# Patient Record
Sex: Male | Born: 1970 | Race: White | Hispanic: No | Marital: Single | Smoking: Never smoker
Health system: Southern US, Community
[De-identification: ages and names within clinical notes are randomized; demographics above are authoritative.]

## PROBLEM LIST (undated history)

## (undated) DIAGNOSIS — F259 Schizoaffective disorder, unspecified: Secondary | ICD-10-CM

## (undated) DIAGNOSIS — F71 Moderate intellectual disabilities: Secondary | ICD-10-CM

## (undated) DIAGNOSIS — E079 Disorder of thyroid, unspecified: Secondary | ICD-10-CM

## (undated) DIAGNOSIS — L309 Dermatitis, unspecified: Secondary | ICD-10-CM

## (undated) HISTORY — PX: OTHER SURGICAL HISTORY: SHX169

## (undated) HISTORY — PX: HAND SURGERY: SHX662

---

## 2012-05-13 ENCOUNTER — Inpatient Hospital Stay: Payer: Self-pay | Admitting: Psychiatry

## 2012-05-13 LAB — DRUG SCREEN, URINE
Amphetamines, Ur Screen: NEGATIVE (ref ?–1000)
Barbiturates, Ur Screen: NEGATIVE (ref ?–200)
Cannabinoid 50 Ng, Ur ~~LOC~~: NEGATIVE (ref ?–50)
Cocaine Metabolite,Ur ~~LOC~~: NEGATIVE (ref ?–300)
Methadone, Ur Screen: NEGATIVE (ref ?–300)
Opiate, Ur Screen: NEGATIVE (ref ?–300)

## 2012-05-13 LAB — URINALYSIS, COMPLETE
Glucose,UR: NEGATIVE mg/dL (ref 0–75)
Hyaline Cast: 4
Leukocyte Esterase: NEGATIVE
Nitrite: NEGATIVE
Protein: 30
RBC,UR: 46 /HPF (ref 0–5)
Specific Gravity: 1.026 (ref 1.003–1.030)
WBC UR: 12 /HPF (ref 0–5)

## 2012-05-13 LAB — COMPREHENSIVE METABOLIC PANEL
Albumin: 3.4 g/dL (ref 3.4–5.0)
Alkaline Phosphatase: 66 U/L (ref 50–136)
Anion Gap: 9 (ref 7–16)
BUN: 25 mg/dL — ABNORMAL HIGH (ref 7–18)
Chloride: 108 mmol/L — ABNORMAL HIGH (ref 98–107)
Co2: 25 mmol/L (ref 21–32)
Creatinine: 1.44 mg/dL — ABNORMAL HIGH (ref 0.60–1.30)
EGFR (Non-African Amer.): 59 — ABNORMAL LOW
Glucose: 116 mg/dL — ABNORMAL HIGH (ref 65–99)
Potassium: 3.8 mmol/L (ref 3.5–5.1)
SGOT(AST): 57 U/L — ABNORMAL HIGH (ref 15–37)
SGPT (ALT): 31 U/L
Sodium: 142 mmol/L (ref 136–145)
Total Protein: 7.2 g/dL (ref 6.4–8.2)

## 2012-05-13 LAB — CBC
HGB: 13.1 g/dL (ref 13.0–18.0)
MCH: 31.1 pg (ref 26.0–34.0)
RBC: 4.2 10*6/uL — ABNORMAL LOW (ref 4.40–5.90)
RDW: 15 % — ABNORMAL HIGH (ref 11.5–14.5)
WBC: 5.8 10*3/uL (ref 3.8–10.6)

## 2012-05-13 LAB — ETHANOL
Ethanol %: <0.003 % (ref 0.000–0.080)
Ethanol: <3 mg/dL

## 2012-05-13 LAB — VALPROIC ACID LEVEL: Valproic Acid: 36 ug/mL — ABNORMAL LOW

## 2012-05-13 LAB — TSH: Thyroid Stimulating Horm: 2.05 u[IU]/mL

## 2012-05-15 LAB — LIPID PANEL
HDL Cholesterol: 22 mg/dL — ABNORMAL LOW (ref 40–60)
VLDL Cholesterol, Calc: 24 mg/dL (ref 5–40)

## 2012-05-18 LAB — URINALYSIS, COMPLETE
Bilirubin,UR: NEGATIVE
Ketone: NEGATIVE
Leukocyte Esterase: NEGATIVE
Ph: 6 (ref 4.5–8.0)
RBC,UR: 15 /HPF (ref 0–5)
Specific Gravity: 1.012 (ref 1.003–1.030)
Squamous Epithelial: NONE SEEN
WBC UR: 1 /HPF (ref 0–5)

## 2012-05-18 LAB — VALPROIC ACID LEVEL: Valproic Acid: 68 ug/mL

## 2012-05-20 LAB — URINALYSIS, COMPLETE
Bacteria: NONE SEEN
Glucose,UR: NEGATIVE mg/dL (ref 0–75)
Ketone: NEGATIVE
Leukocyte Esterase: NEGATIVE
Ph: 8 (ref 4.5–8.0)
WBC UR: <1 /HPF (ref 0–5)

## 2012-06-14 ENCOUNTER — Emergency Department: Payer: Self-pay | Admitting: Emergency Medicine

## 2012-06-14 LAB — URINALYSIS, COMPLETE
Ketone: NEGATIVE
Nitrite: NEGATIVE
Ph: 6 (ref 4.5–8.0)
Protein: NEGATIVE
RBC,UR: 13 /HPF (ref 0–5)
Specific Gravity: 1.016 (ref 1.003–1.030)

## 2012-06-14 LAB — CBC
HCT: 35.1 % — ABNORMAL LOW (ref 40.0–52.0)
HGB: 11.6 g/dL — ABNORMAL LOW (ref 13.0–18.0)
MCV: 94 fL (ref 80–100)
Platelet: 110 10*3/uL — ABNORMAL LOW (ref 150–440)
RBC: 3.73 10*6/uL — ABNORMAL LOW (ref 4.40–5.90)
RDW: 13.9 % (ref 11.5–14.5)
WBC: 3.6 10*3/uL — ABNORMAL LOW (ref 3.8–10.6)

## 2012-06-14 LAB — DRUG SCREEN, URINE
Barbiturates, Ur Screen: NEGATIVE (ref ?–200)
Benzodiazepine, Ur Scrn: NEGATIVE (ref ?–200)
Cannabinoid 50 Ng, Ur ~~LOC~~: NEGATIVE (ref ?–50)
MDMA (Ecstasy)Ur Screen: NEGATIVE (ref ?–500)
Opiate, Ur Screen: NEGATIVE (ref ?–300)
Phencyclidine (PCP) Ur S: NEGATIVE (ref ?–25)

## 2012-06-14 LAB — COMPREHENSIVE METABOLIC PANEL
Alkaline Phosphatase: 53 U/L (ref 50–136)
Anion Gap: 3 — ABNORMAL LOW (ref 7–16)
Bilirubin,Total: 0.5 mg/dL (ref 0.2–1.0)
Chloride: 108 mmol/L — ABNORMAL HIGH (ref 98–107)
Co2: 32 mmol/L (ref 21–32)
Creatinine: 1.02 mg/dL (ref 0.60–1.30)
EGFR (African American): >60
EGFR (Non-African Amer.): >60
SGOT(AST): 56 U/L — ABNORMAL HIGH (ref 15–37)
SGPT (ALT): 30 U/L
Sodium: 143 mmol/L (ref 136–145)

## 2012-06-14 LAB — ACETAMINOPHEN LEVEL: Acetaminophen: <2 ug/mL

## 2012-06-14 LAB — VALPROIC ACID LEVEL: Valproic Acid: 26 ug/mL — ABNORMAL LOW

## 2013-03-18 ENCOUNTER — Emergency Department: Payer: Self-pay | Admitting: Emergency Medicine

## 2013-03-18 LAB — COMPREHENSIVE METABOLIC PANEL
Albumin: 3.2 g/dL — ABNORMAL LOW (ref 3.4–5.0)
Alkaline Phosphatase: 53 U/L (ref 50–136)
BUN: 18 mg/dL (ref 7–18)
Co2: 28 mmol/L (ref 21–32)
EGFR (African American): >60
Osmolality: 288 (ref 275–301)
Sodium: 143 mmol/L (ref 136–145)
Total Protein: 6.7 g/dL (ref 6.4–8.2)

## 2013-03-18 LAB — DRUG SCREEN, URINE
Amphetamines, Ur Screen: NEGATIVE (ref ?–1000)
Barbiturates, Ur Screen: NEGATIVE (ref ?–200)
Cannabinoid 50 Ng, Ur ~~LOC~~: NEGATIVE (ref ?–50)
Cocaine Metabolite,Ur ~~LOC~~: NEGATIVE (ref ?–300)
MDMA (Ecstasy)Ur Screen: NEGATIVE (ref ?–500)
Phencyclidine (PCP) Ur S: NEGATIVE (ref ?–25)

## 2013-03-18 LAB — URINALYSIS, COMPLETE
Bacteria: NONE SEEN
Glucose,UR: NEGATIVE mg/dL (ref 0–75)
Ketone: NEGATIVE
Nitrite: NEGATIVE
Ph: 6 (ref 4.5–8.0)
Protein: 30
Squamous Epithelial: <1
WBC UR: 1 /HPF (ref 0–5)

## 2013-03-18 LAB — CBC
HCT: 36.9 % — ABNORMAL LOW (ref 40.0–52.0)
HGB: 12 g/dL — ABNORMAL LOW (ref 13.0–18.0)
MCH: 30 pg (ref 26.0–34.0)
Platelet: 105 10*3/uL — ABNORMAL LOW (ref 150–440)
WBC: 3.9 10*3/uL (ref 3.8–10.6)

## 2013-04-04 ENCOUNTER — Emergency Department: Payer: Self-pay | Admitting: Emergency Medicine

## 2013-08-14 ENCOUNTER — Emergency Department: Payer: Self-pay | Admitting: Emergency Medicine

## 2013-08-14 LAB — TSH: Thyroid Stimulating Horm: 2.37 u[IU]/mL

## 2013-08-14 LAB — COMPREHENSIVE METABOLIC PANEL
Alkaline Phosphatase: 58 U/L (ref 50–136)
BUN: 19 mg/dL — ABNORMAL HIGH (ref 7–18)
Bilirubin,Total: 0.5 mg/dL (ref 0.2–1.0)
Calcium, Total: 8.4 mg/dL — ABNORMAL LOW (ref 8.5–10.1)
Chloride: 108 mmol/L — ABNORMAL HIGH (ref 98–107)
Creatinine: 1.34 mg/dL — ABNORMAL HIGH (ref 0.60–1.30)
EGFR (African American): >60
EGFR (Non-African Amer.): >60
Potassium: 3.5 mmol/L (ref 3.5–5.1)
SGOT(AST): 57 U/L — ABNORMAL HIGH (ref 15–37)
Total Protein: 6.4 g/dL (ref 6.4–8.2)

## 2013-08-14 LAB — SALICYLATE LEVEL: Salicylates, Serum: <1.7 mg/dL

## 2013-08-14 LAB — CBC
HCT: 34.9 % — ABNORMAL LOW (ref 40.0–52.0)
HGB: 11.7 g/dL — ABNORMAL LOW (ref 13.0–18.0)
MCH: 31.2 pg (ref 26.0–34.0)
MCHC: 33.6 g/dL (ref 32.0–36.0)
MCV: 93 fL (ref 80–100)
RDW: 13.3 % (ref 11.5–14.5)
WBC: 4.4 10*3/uL (ref 3.8–10.6)

## 2013-08-14 LAB — ACETAMINOPHEN LEVEL: Acetaminophen: <2 ug/mL

## 2013-08-14 LAB — ETHANOL: Ethanol %: <0.003 % (ref 0.000–0.080)

## 2013-08-15 LAB — URINALYSIS, COMPLETE
Bacteria: NONE SEEN
Bilirubin,UR: NEGATIVE
Glucose,UR: NEGATIVE mg/dL (ref 0–75)
Ph: 6 (ref 4.5–8.0)
Protein: NEGATIVE
RBC,UR: 10 /HPF (ref 0–5)
Squamous Epithelial: <1
WBC UR: 1 /HPF (ref 0–5)

## 2013-08-15 LAB — DRUG SCREEN, URINE
Barbiturates, Ur Screen: NEGATIVE (ref ?–200)
Cocaine Metabolite,Ur ~~LOC~~: NEGATIVE (ref ?–300)
MDMA (Ecstasy)Ur Screen: NEGATIVE (ref ?–500)
Opiate, Ur Screen: NEGATIVE (ref ?–300)
Phencyclidine (PCP) Ur S: NEGATIVE (ref ?–25)
Tricyclic, Ur Screen: NEGATIVE (ref ?–1000)

## 2013-11-22 ENCOUNTER — Emergency Department: Payer: Self-pay | Admitting: Emergency Medicine
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
## 2015-03-17 NOTE — Discharge Summary (Signed)
PATIENT NAME:  Bradley Murphy, Bradley Murphy MR#:  [id number] DATE OF BIRTH:  [birth year]  DATE OF ADMISSION:  05/13/2012 DATE OF DISCHARGE:  05/20/2012   HOSPITAL COURSE: See dictated history and physical for details. This is a 44 year old man with a history of unspecified mental illness, possibly schizoaffective disorder as well as chronic cognitive dysfunction, probably moderate mental retardation, who was brought to the hospital after making an alleged suicide attempt and voicing suicidal ideation. In the hospital he initially did state some suicidal ideation and repeated that he felt that nobody cared for him. Here in the hospital he has not attempted to act on it or to try to hurt himself. He was initially down and dysphoric and somewhat reserved but he was easy to engage in groups and activities. He seems to enjoy being around other people and being included in groups. He was never very verbal but he was appropriate in his behavior. Some changes were made to his medication. I have increased his Remeron for more effective treatment of depression. I have discontinued his Haldol because he looks to me like he may have some tardive dyskinesia or some stiffness. I have greatly decreased the total dose of Cogentin that he is taking because of the fact that it was really an excessively high dose and could have been causing hallucinations and confusion. He has tolerated all of these changes. I have started him on a low dose of Seroquel at night. He continues to be on his Abilify. The intention at this point is that it could be considered that he could be switched from one to the other in order to decrease risk of extrapyramidal symptoms. He now has a valproic acid level last tested at 68, which is in the usually thought of as therapeutic range. One medical issue that has arisen is he has microscopic hematuria persistent in his urinalysis. Medicine was consulted and felt that if it persisted he should have a Urology consult. The  patient is not symptomatic from it. We recommend that this be followed up with his medical problems in the future to see if it persists and if it needs to be evaluated. At this time he is denying any suicidal ideation and not behaving in a dangerous suicidal or aggressive manner and not endorsing any hallucinations or psychotic symptoms. He is agreeable to discharge back to his group home.   LABORATORY, DIAGNOSTIC, AND RADIOLOGICAL DATA: Admission chemistries showed hematocrit slightly low at 38.9. Glucose was elevated at 116, BUN 25, creatinine 1.44, chloride 108, AST 57. Alcohol not detectable. TSH normal. Urinalysis had 3+ blood, 30 protein, 46 red blood cells, and 12 white blood cells. Drug screen was negative. Valproic acid level on admission 36.   EKG showed first degree AV block, otherwise nonspecific ST abnormality. QTc 437, not elevated.   Total cholesterol 116, triglycerides 121, HDL low at 22, VLDL 24, LDL 70. Repeat urinalysis shows white blood cells diminishing to gone, no bacteria but persistent microscopic hematuria.   DISCHARGE MEDICATIONS:  1. Quetiapine 100 mg at bedtime.  2. Mirtazapine 45 mg at bedtime.  3. Cogentin 0.5 mg 3 times a day.  4. Depakote extended-release 1500 mg at bedtime.  5. Lamisil 250 mg per day.  6. Paxil 40 mg per day.  7. Fenofibric acid 135 mg per day.  8. Ativan 1 mg 3 times a day.  9. Synthroid 50 mcg per a.m.  10. Docusate 100 mg at night.  11. Aripiprazole 30 mg per day.   MENTAL  STATUS EXAMINATION: Casually but neatly dressed man whose hygiene is greatly improved. Cooperative and pleasant with the interview. Good eye contact. Limited psychomotor activity. Affect stays blunted and flat most of the time. Not agitated. Not tearful. Mood stated as good. Thoughts are very slow and limited in amount. No grossly bizarre thoughts. No psychotic evidence. No evidence of paranoia. Denies hallucinations. Totally denies suicidal or homicidal ideation. Says that  he is looking forward to going back home and wants to take care of his body. The patient is chronically clearly cognitively impaired. Speech is slow. He is oriented only very generally to being in the hospital. Judgment and insight cannot be relied on with his degree of cognitive impairment.   DISPOSITION:  1. Discharge back to group home. 2. Continue current medication. 3. Follow-up will be presumed to be [facility].   DIAGNOSES PRINCIPLE AND PRIMARY:  AXIS I: Schizoaffective disorder, bipolar type.   SECONDARY DIAGNOSES:  AXIS I: No further diagnosis.   AXIS II: Mental retardation, probably moderate severity.   AXIS III:  1. Microscopic hematuria. 2. History of abnormal lipids.  3. Hypothyroid. 4. Constipation.   AXIS IV: Moderate to severe stress from having recently changed group homes, having limited social contact in the area.   AXIS V: Functioning at time of discharge 48.   ____________________________ Audery AmelJohn T. May Manrique, MD jtc:drc D: 05/20/2012 11:00:00 ET T: 05/20/2012 13:44:26 ET JOB#: [id number]  cc: Audery AmelJohn T. Awilda Covin, MD, <Dictator> Audery AmelJOHN T Caedyn Tassinari MD ELECTRONICALLY SIGNED 05/21/2012 23:18
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
## 2016-01-11 ENCOUNTER — Emergency Department
Admission: EM | Admit: 2016-01-11 | Discharge: 2016-01-13 | Disposition: A | Discharge status: Other Acute Inpatient Hospital | Payer: Medicaid Other | Attending: Emergency Medicine | Admitting: Emergency Medicine

## 2016-01-11 DIAGNOSIS — F329 Major depressive disorder, single episode, unspecified: Secondary | ICD-10-CM

## 2016-01-11 DIAGNOSIS — Z046 Encounter for general psychiatric examination, requested by authority: Secondary | ICD-10-CM | POA: Diagnosis present

## 2016-01-11 DIAGNOSIS — F1510 Other stimulant abuse, uncomplicated: Secondary | ICD-10-CM | POA: Insufficient documentation

## 2016-01-11 DIAGNOSIS — F32A Depression, unspecified: Secondary | ICD-10-CM

## 2016-01-11 DIAGNOSIS — F1310 Sedative, hypnotic or anxiolytic abuse, uncomplicated: Secondary | ICD-10-CM | POA: Insufficient documentation

## 2016-01-11 DIAGNOSIS — F1610 Hallucinogen abuse, uncomplicated: Secondary | ICD-10-CM | POA: Diagnosis not present

## 2016-01-11 DIAGNOSIS — F333 Major depressive disorder, recurrent, severe with psychotic symptoms: Secondary | ICD-10-CM | POA: Diagnosis not present

## 2016-01-11 DIAGNOSIS — R45851 Suicidal ideations: Secondary | ICD-10-CM

## 2016-01-11 HISTORY — DX: Dermatitis, unspecified: L30.9

## 2016-01-11 HISTORY — DX: Moderate intellectual disabilities: F71

## 2016-01-11 HISTORY — DX: Schizoaffective disorder, unspecified: F25.9

## 2016-01-11 HISTORY — DX: Disorder of thyroid, unspecified: E07.9

## 2016-01-11 NOTE — ED Notes (Signed)
Pt here voluntarily but says he's suicidal; [place] officer sitting with pt between triage rooms 1 and 2 until he is triaged by staff and moved to treatment room
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
## 2016-01-12 ENCOUNTER — Encounter: Payer: Self-pay | Admitting: Emergency Medicine

## 2016-01-12 LAB — CBC
HCT: 39 % — ABNORMAL LOW (ref 40.0–52.0)
Hemoglobin: 13.2 g/dL (ref 13.0–18.0)
MCH: 30.4 pg (ref 26.0–34.0)
MCHC: 34 g/dL (ref 32.0–36.0)
MCV: 89.5 fL (ref 80.0–100.0)
PLATELETS: 118 10*3/uL — AB (ref 150–440)
RBC: 4.36 MIL/uL — AB (ref 4.40–5.90)
RDW: 13.7 % (ref 11.5–14.5)
WBC: 4.7 10*3/uL (ref 3.8–10.6)

## 2016-01-12 LAB — COMPREHENSIVE METABOLIC PANEL
ALT: 20 U/L (ref 17–63)
ANION GAP: 8 (ref 5–15)
AST: 36 U/L (ref 15–41)
Albumin: 3.7 g/dL (ref 3.5–5.0)
Alkaline Phosphatase: 52 U/L (ref 38–126)
BUN: 19 mg/dL (ref 6–20)
CHLORIDE: 107 mmol/L (ref 101–111)
CO2: 27 mmol/L (ref 22–32)
CREATININE: 1.39 mg/dL — AB (ref 0.61–1.24)
Calcium: 9 mg/dL (ref 8.9–10.3)
GFR, EST NON AFRICAN AMERICAN: 59 mL/min — AB (ref >60)
Glucose, Bld: 143 mg/dL — ABNORMAL HIGH (ref 65–99)
POTASSIUM: 3.7 mmol/L (ref 3.5–5.1)
Sodium: 142 mmol/L (ref 135–145)
Total Bilirubin: 0.4 mg/dL (ref 0.3–1.2)
Total Protein: 7 g/dL (ref 6.5–8.1)

## 2016-01-12 LAB — URINE DRUG SCREEN, QUALITATIVE (ARMC ONLY)
Amphetamines, Ur Screen: NOT DETECTED
BARBITURATES, UR SCREEN: NOT DETECTED
Benzodiazepine, Ur Scrn: POSITIVE — AB
COCAINE METABOLITE, UR ~~LOC~~: NOT DETECTED
Cannabinoid 50 Ng, Ur ~~LOC~~: NOT DETECTED
MDMA (ECSTASY) UR SCREEN: POSITIVE — AB
METHADONE SCREEN, URINE: NOT DETECTED
Opiate, Ur Screen: NOT DETECTED
Phencyclidine (PCP) Ur S: POSITIVE — AB
TRICYCLIC, UR SCREEN: POSITIVE — AB

## 2016-01-12 LAB — SALICYLATE LEVEL

## 2016-01-12 LAB — ETHANOL: Alcohol, Ethyl (B): <5 mg/dL (ref <5)

## 2016-01-12 LAB — ACETAMINOPHEN LEVEL

## 2016-01-12 MED ORDER — SODIUM CHLORIDE 0.9 % IV BOLUS (SEPSIS)
1000.0000 mL | Freq: Once | INTRAVENOUS | Status: AC
  Administered 2016-01-12: 1000 mL via INTRAVENOUS

## 2016-01-12 NOTE — BH Assessment (Signed)
Assessment Completed  Consulted with Alveda Reasons, Nurse Practitioner Denice Bors), who discussed patient with Dr. Lynnae Sandhoff (Psychiatrist) and they recommend Outpatient Treatment. Patient can be discharge back to Group home.  ER MD (Dr. Fanny Bien) informed of the recommendation
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
## 2016-01-12 NOTE — ED Notes (Signed)
Patient asleep in room. No noted distress or abnormal behavior. Will continue 15 minute checks and observation by security cameras for safety. 

## 2016-01-12 NOTE — ED Notes (Signed)
BEHAVIORAL HEALTH ROUNDING Patient sleeping: Yes.   Patient alert and oriented: no Behavior appropriate: Yes.  ; If no, describe:  Nutrition and fluids offered: No Toileting and hygiene offered: No Sitter present: not applicable Law enforcement present: Yes  

## 2016-01-12 NOTE — ED Notes (Signed)
Meal given. Patient in no acute distress. Maintained on 15 minute checks and observation by security camera for safety. 

## 2016-01-12 NOTE — ED Notes (Signed)
Report from dawn, rn. Pt cleared to move to [facility].

## 2016-01-12 NOTE — ED Notes (Signed)
Pt with hr of 115 lying down, sitting up 134. Dr. Zenda Alpers notified.

## 2016-01-12 NOTE — ED Notes (Signed)
Pt oriented to unit. Pt alert to self, situation, year, month and day of week. Pt denies needs. Pt moving all extremities without difficulty. resps unlabored, skin pwd. Hr 96 by radial pulse.

## 2016-01-12 NOTE — ED Notes (Signed)
ED BHU PLACEMENT JUSTIFICATION Is the patient under IVC or is there intent for IVC: No. Is the patient medically cleared: Yes.   Is there vacancy in the ED BHU: Yes.   Is the population mix appropriate for patient: Yes.   Is the patient awaiting placement in inpatient or outpatient setting: Yes.   Has the patient had a psychiatric consult: No Survey of unit performed for contraband, proper placement and condition of furniture, tampering with fixtures in bathroom, shower, and each patient room: Yes.  ; Findings:  APPEARANCE/BEHAVIOR calm, cooperative and adequate rapport can be established NEURO ASSESSMENT Orientation: time, place and person Hallucinations: No.None noted (Hallucinations) Speech: Normal Gait: shuffle RESPIRATORY ASSESSMENT Normal expansion.  Clear to auscultation.  No rales, rhonchi, or wheezing. CARDIOVASCULAR ASSESSMENT regular rate and rhythm, S1, S2 normal, no murmur, click, rub or gallop GASTROINTESTINAL ASSESSMENT soft, nontender, BS WNL, no r/g EXTREMITIES normal strength, tone, and muscle mass PLAN OF CARE Provide calm/safe environment. Vital signs assessed twice daily. ED BHU Assessment once each 12-hour shift. Collaborate with intake RN daily or as condition indicates. Assure the ED provider has rounded once each shift. Provide and encourage hygiene. Provide redirection as needed. Assess for escalating behavior; address immediately and inform ED provider.  Assess family dynamic and appropriateness for visitation as needed: Yes.  ; If necessary, describe findings:  Educate the patient/family about BHU procedures/visitation: Yes.  ; If necessary, describe findings:  

## 2016-01-12 NOTE — ED Notes (Signed)
Patient requesting discharge back to group home but has not been evaluated by [facility].  Maintained on 15 minute checks and observation by security camera for safety.

## 2016-01-12 NOTE — ED Notes (Signed)
Pt. Moved to Room #24 in ED to have [facility].
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
## 2016-01-12 NOTE — ED Notes (Signed)
BEHAVIORAL HEALTH ROUNDING Patient sleeping: Yes.   Patient alert and oriented: no Behavior appropriate: Yes.  ; If no, describe:  Nutrition and fluids offered: No Toileting and hygiene offered: No Sitter present: No Law enforcement present: Yes  

## 2016-01-12 NOTE — ED Notes (Signed)
Patient now denies SI or HI. Feels safe to return to group home. Waiting for psychiatric consult.Maintained on 15 minute checks and observation by security camera for safety.
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
## 2016-01-12 NOTE — ED Notes (Signed)

## 2016-01-12 NOTE — ED Notes (Signed)
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
## 2016-01-13 DIAGNOSIS — F333 Major depressive disorder, recurrent, severe with psychotic symptoms: Secondary | ICD-10-CM

## 2016-01-13 MED ORDER — LEVOTHYROXINE SODIUM 100 MCG PO TABS
50.0000 ug | ORAL_TABLET | Freq: Every day | ORAL | Status: DC
  Administered 2016-01-13: 50 ug via ORAL
  Filled 2016-01-13: qty 1

## 2016-01-13 MED ORDER — LORAZEPAM 1 MG PO TABS
1.0000 mg | ORAL_TABLET | Freq: Four times a day (QID) | ORAL | Status: DC
  Administered 2016-01-13: 1 mg via ORAL
  Filled 2016-01-13: qty 1

## 2016-01-13 MED ORDER — DARIFENACIN HYDROBROMIDE ER 7.5 MG PO TB24
7.5000 mg | ORAL_TABLET | Freq: Every day | ORAL | Status: DC
  Administered 2016-01-13: 7.5 mg via ORAL
  Filled 2016-01-13: qty 1

## 2016-01-13 MED ORDER — DOCUSATE SODIUM 100 MG PO CAPS
100.0000 mg | ORAL_CAPSULE | Freq: Every day | ORAL | Status: DC
  Administered 2016-01-13: 100 mg via ORAL
  Filled 2016-01-13: qty 1

## 2016-01-13 MED ORDER — LORAZEPAM 2 MG PO TABS: 2.0000 mg | ORAL_TABLET | Freq: Four times a day (QID) | ORAL | Status: DC | PRN

## 2016-01-13 MED ORDER — HALOPERIDOL 5 MG PO TABS: 5.0000 mg | ORAL_TABLET | Freq: Four times a day (QID) | ORAL | Status: DC | PRN

## 2016-01-13 MED ORDER — DIVALPROEX SODIUM 500 MG PO DR TAB: 1000.0000 mg | DELAYED_RELEASE_TABLET | Freq: Every day | ORAL | Status: DC

## 2016-01-13 MED ORDER — MIRTAZAPINE 15 MG PO TABS: 45.0000 mg | ORAL_TABLET | Freq: Every day | ORAL | Status: DC

## 2016-01-13 MED ORDER — FENOFIBRATE 160 MG PO TABS
160.0000 mg | ORAL_TABLET | Freq: Every day | ORAL | Status: DC
  Administered 2016-01-13: 160 mg via ORAL
  Filled 2016-01-13: qty 1

## 2016-01-13 MED ORDER — PAROXETINE HCL 20 MG PO TABS: 40.0000 mg | ORAL_TABLET | Freq: Every day | ORAL | Status: DC

## 2016-01-13 MED ORDER — TERBINAFINE HCL 250 MG PO TABS
250.0000 mg | ORAL_TABLET | Freq: Every day | ORAL | Status: DC
  Administered 2016-01-13: 250 mg via ORAL
  Filled 2016-01-13: qty 1

## 2016-01-13 MED ORDER — BENZTROPINE MESYLATE 1 MG PO TABS
1.0000 mg | ORAL_TABLET | Freq: Two times a day (BID) | ORAL | Status: DC
  Administered 2016-01-13: 1 mg via ORAL
  Filled 2016-01-13: qty 1

## 2016-01-13 MED ORDER — ARIPIPRAZOLE 10 MG PO TABS: 40.0000 mg | ORAL_TABLET | Freq: Every day | ORAL | Status: DC

## 2016-01-13 NOTE — ED Notes (Signed)
BEHAVIORAL HEALTH ROUNDING Patient sleeping: Yes.   Patient alert and oriented: not applicable Behavior appropriate: Yes.  ; If no, describe:  Nutrition and fluids offered: No Toileting and hygiene offered: No Sitter present: not applicable Law enforcement present: Yes  

## 2016-01-13 NOTE — ED Notes (Signed)
BEHAVIORAL HEALTH ROUNDING Patient sleeping: Yes.   Patient alert and oriented: no Behavior appropriate: Yes.  ; If no, describe:  Nutrition and fluids offered: No Toileting and hygiene offered: No Sitter present: not applicable Law enforcement present: Yes  

## 2016-01-13 NOTE — ED Notes (Signed)
Patient is awake and alert. He is aware that he is in the hospital. Requesting to return to group home. He denies SI. Patient ate breakfast and took all medications without incident. Maintained on 15 minute checks and observation by security camera for safety.

## 2016-01-13 NOTE — ED Provider Notes (Signed)
-----------------------------------------   2:46 AM on 01/13/2016 -----------------------------------------   Blood pressure 100/57, pulse 80, temperature 98.5 F (36.9 C), temperature source Oral, resp. rate 20, height  (1.626 m), weight 81.647 kg, SpO2 97 %.  The patient had no acute events since last update.  Calm and cooperative at this time.  Psych specialist on-call diagnosis the patient with severe depressive disorder, recurrent episode, with psychotic features.  Inpatient admission is recommended.  I have reviewed the printed report and put in medication orders as recommended.   Loleta Rose, MD 01/13/16 [phone]

## 2016-01-13 NOTE — Discharge Instructions (Signed)
Major Depressive Disorder Major depressive disorder is a mental illness. It also may be called clinical depression or unipolar depression. Major depressive disorder usually causes feelings of sadness, hopelessness, or helplessness. Some people with this disorder do not feel particularly sad but lose interest in doing things they used to enjoy (anhedonia). Major depressive disorder also can cause physical symptoms. It can interfere with work, school, relationships, and other normal everyday activities. The disorder varies in severity but is longer lasting and more serious than the sadness we all feel from time to time in our lives. Major depressive disorder often is triggered by stressful life events or major life changes. Examples of these triggers include divorce, loss of your job or home, a move, and the death of a family member or close friend. Sometimes this disorder occurs for no obvious reason at all. People who have family members with major depressive disorder or bipolar disorder are at higher risk for developing this disorder, with or without life stressors. Major depressive disorder can occur at any age. It may occur just once in your life (single episode major depressive disorder). It may occur multiple times (recurrent major depressive disorder). SYMPTOMS People with major depressive disorder have either anhedonia or depressed mood on nearly a daily basis for at least 2 weeks or longer. Symptoms of depressed mood include:  Feelings of sadness (blue or down in the dumps) or emptiness.  Feelings of hopelessness or helplessness.  Tearfulness or episodes of crying (may be observed by others).  Irritability (children and adolescents). In addition to depressed mood or anhedonia or both, people with this disorder have at least four of the following symptoms:  Difficulty sleeping or sleeping too much.   Significant change (increase or decrease) in appetite or weight.   Lack of energy or  motivation.  Feelings of guilt and worthlessness.   Difficulty concentrating, remembering, or making decisions.  Unusually slow movement (psychomotor retardation) or restlessness (as observed by others).   Recurrent wishes for death, recurrent thoughts of self-harm (suicide), or a suicide attempt. People with major depressive disorder commonly have persistent negative thoughts about themselves, other people, and the world. People with severe major depressive disorder may experiencedistorted beliefs or perceptions about the world (psychotic delusions). They also may see or hear things that are not real (psychotic hallucinations). DIAGNOSIS Major depressive disorder is diagnosed through an assessment by your health care provider. Your health care provider will ask aboutaspects of your daily life, such as mood,sleep, and appetite, to see if you have the diagnostic symptoms of major depressive disorder. Your health care provider may ask about your medical history and use of alcohol or drugs, including prescription medicines. Your health care provider also may do a physical exam and blood work. This is because certain medical conditions and the use of certain substances can cause major depressive disorder-like symptoms (secondary depression). Your health care provider also may refer you to a mental health specialist for further evaluation and treatment. TREATMENT It is important to recognize the symptoms of major depressive disorder and seek treatment. The following treatments can be prescribed for this disorder:   Medicine. Antidepressant medicines usually are prescribed. Antidepressant medicines are thought to correct chemical imbalances in the brain that are commonly associated with major depressive disorder. Other types of medicine may be added if the symptoms do not respond to antidepressant medicines alone or if psychotic delusions or hallucinations occur.  Talk therapy. Talk therapy can be  helpful in treating major depressive disorder by providing   support, education, and guidance. Certain types of talk therapy also can help with negative thinking (cognitive behavioral therapy) and with relationship issues that trigger this disorder (interpersonal therapy). A mental health specialist can help determine which treatment is best for you. Most people with major depressive disorder do well with a combination of medicine and talk therapy. Treatments involving electrical stimulation of the brain can be used in situations with extremely severe symptoms or when medicine and talk therapy do not work over time. These treatments include electroconvulsive therapy, transcranial magnetic stimulation, and vagal nerve stimulation.   This information is not intended to replace advice given to you by your health care provider. Make sure you discuss any questions you have with your health care provider.   Document Released: 03/06/2013 Document Revised: 11/30/2014 Document Reviewed: 03/06/2013 Elsevier Interactive Patient Education 2016 Elsevier Inc.  

## 2016-01-13 NOTE — ED Notes (Signed)
Pt. Returned to [facility] #4, SOC complete, [facility] doctor recommends inpatient treatment.

## 2016-01-13 NOTE — ED Notes (Signed)
Patient discharged ambulatory back to group home, accompanied by caregiver. Patient denies SI. Caregiver verbalizes understanding of DC instructions. Patient received all personal belongings.

## 2016-01-13 NOTE — ED Notes (Signed)

## 2016-01-13 NOTE — Progress Notes (Signed)
LCSW called Group home owner Roma Kayser [phone]/ [phone] He will arrange to have one of his staff pick up patient. Consulted with [facility] nurse and advised [facility] has been notified patient ready for discharge.  NOTE: Number on face sheet-Not active group home provider.  [employer] LCSW [phone]

## 2016-01-13 NOTE — ED Provider Notes (Signed)
Patient evaluated by Dr. Garnetta Buddy psychiatrist with plan for discharge and outpatient follow-up  Darci Current, MD 01/13/16 1145

## 2016-01-13 NOTE — Consult Note (Signed)
[facility] Psych ED Progress Note  01/13/2016 10:09 AM Bradley Murphy  MRN:  [id number] Subjective:    Patient is a 45 year old male with history of schizoaffective disorder moderate mental retardation who was admitted due to suicidal ideations and a plan to punch himself. He was evaluated during the weekend by the specialist on call as well as by the nurse practitioner from [place]. During my interview patient was lying in the bed. It was difficult to get a coherent history from the patient. Staff reported that patient has been doing well and is not exhibiting any behavioral issues since he was admitted to the hospital. He has been compliant with his medications. He also has some behavioral problems with his roommate.  Thi patient currently follows with his physician on a regular basis and has been compliant with his medications. He does not have any adverse reactions to his medications. He has some tardive dyskinesia or stiffness in his muscle which seems to be long-term. No acute issues or new adverse effects of the medications are noted at this time. His medications were discussed with the staff and it was noted that patient is doing well on his current psychotropic medications and they do not need to be adjusted. Patient slept well last night and has been  eating well. His group home is willing to take him back as he is not expressing any suicidal homicidal ideations or plans at this time. He does not seem to be dangerous or aggressive in any manner and is not endorsing any perceptual disturbances   Principal Problem: Schizoaffective disorder  Diagnosis:  schizoaffective disorder  Total Time spent with patient: 30 minutes  Past Psychiatric History: History of schizoaffective disorder and mental retardation.  Past Medical History:  Past Medical History  Diagnosis Date  . Schizoaffective disorder ([place])   . Thyroid disease   . Eczema   . Moderate mental retardation     Past Surgical History   Procedure Laterality Date  . Shoulder surgery Right   . Hand surgery Right    Family History: No family history on file.  Social History:  History  Alcohol Use No     History  Drug Use Not on file    Social History   Social History  . Marital Status: Single    Spouse Name: N/A  . Number of Children: N/A  . Years of Education: N/A   Social History Main Topics  . Smoking status: Never Smoker   . Smokeless tobacco: Former Systems developer  . Alcohol Use: No  . Drug Use: None  . Sexual Activity: Not Asked   Other Topics Concern  . None   Social History Narrative  . None    Sleep: Fair  Appetite:  Fair  Current Medications: Current Facility-Administered Medications  Medication Dose Route Frequency Provider Last Rate Last Dose  . ARIPiprazole (ABILIFY) tablet 40 mg  40 mg Oral QHS Hinda Kehr, MD      . benztropine (COGENTIN) tablet 1 mg  1 mg Oral BID Hinda Kehr, MD   1 mg at 01/13/16 4765  . darifenacin (ENABLEX) 24 hr tablet 7.5 mg  7.5 mg Oral Daily Hinda Kehr, MD   7.5 mg at 01/13/16 0926  . divalproex (DEPAKOTE) DR tablet 1,000 mg  1,000 mg Oral QHS Hinda Kehr, MD      . docusate sodium (COLACE) capsule 100 mg  100 mg Oral Daily Hinda Kehr, MD   100 mg at 01/13/16 0926  . fenofibrate tablet 160 mg  160 mg Oral Daily Hinda Kehr, MD   160 mg at 01/13/16 2902  . haloperidol (HALDOL) tablet 5 mg  5 mg Oral Q6H PRN Hinda Kehr, MD      . levothyroxine (SYNTHROID, LEVOTHROID) tablet 50 mcg  50 mcg Oral QAC breakfast Hinda Kehr, MD   50 mcg at 01/13/16 0817  . LORazepam (ATIVAN) tablet 1 mg  1 mg Oral Q6H Hinda Kehr, MD   1 mg at 01/13/16 0817  . LORazepam (ATIVAN) tablet 2 mg  2 mg Oral Q6H PRN Hinda Kehr, MD      . mirtazapine (REMERON) tablet 45 mg  45 mg Oral QHS Hinda Kehr, MD      . PARoxetine (PAXIL) tablet 40 mg  40 mg Oral QHS Hinda Kehr, MD      . terbinafine (LAMISIL) tablet 250 mg  250 mg Oral Daily Hinda Kehr, MD   250 mg at 01/13/16 1115    Current Outpatient Prescriptions  Medication Sig Dispense Refill  . ARIPiprazole (ABILIFY) 20 MG tablet Take 40 mg by mouth at bedtime.    . benztropine (COGENTIN) 1 MG tablet Take 1 mg by mouth 2 (two) times daily.    . Choline Fenofibrate (TRILIPIX) 135 MG capsule Take 135 mg by mouth daily.    . divalproex (DEPAKOTE) 500 MG DR tablet Take 1,000 mg by mouth at bedtime.    . docusate sodium (COLACE) 100 MG capsule Take 100 mg by mouth daily.    Marland Kitchen ketoconazole (NIZORAL) 2 % shampoo Apply 1 application topically 3 (three) times a week.    . levothyroxine (SYNTHROID, LEVOTHROID) 50 MCG tablet Take 50 mcg by mouth daily before breakfast.    . LORazepam (ATIVAN) 1 MG tablet Take 1 mg by mouth every 6 (six) hours.    . mirtazapine (REMERON) 45 MG tablet Take 45 mg by mouth at bedtime.    Marland Kitchen PARoxetine (PAXIL) 40 MG tablet Take 40 mg by mouth at bedtime.    . solifenacin (VESICARE) 5 MG tablet Take 5 mg by mouth daily.    Marland Kitchen terbinafine (LAMISIL) 250 MG tablet Take 250 mg by mouth daily.      Lab Results:  Results for orders placed or performed during the hospital encounter of 01/11/16 (from the past 48 hour(s))  CBC     Status: Abnormal   Collection Time: 01/11/16  9:18 PM  Result Value Ref Range   WBC 4.7 3.8 - 10.6 K/uL   RBC 4.36 (L) 4.40 - 5.90 MIL/uL   Hemoglobin 13.2 13.0 - 18.0 g/dL   HCT 39.0 (L) 40.0 - 52.0 %   MCV 89.5 80.0 - 100.0 fL   MCH 30.4 26.0 - 34.0 pg   MCHC 34.0 32.0 - 36.0 g/dL   RDW 13.7 11.5 - 14.5 %   Platelets 118 (L) 150 - 440 K/uL  Comprehensive metabolic panel     Status: Abnormal   Collection Time: 01/11/16  9:18 PM  Result Value Ref Range   Sodium 142 135 - 145 mmol/L   Potassium 3.7 3.5 - 5.1 mmol/L   Chloride 107 101 - 111 mmol/L   CO2 27 22 - 32 mmol/L   Glucose, Bld 143 (H) 65 - 99 mg/dL   BUN 19 6 - 20 mg/dL   Creatinine, Ser 1.39 (H) 0.61 - 1.24 mg/dL   Calcium 9.0 8.9 - 10.3 mg/dL   Total Protein 7.0 6.5 - 8.1 g/dL   Albumin 3.7 3.5 - 5.0  g/dL  AST 36 15 - 41 U/L   ALT 20 17 - 63 U/L   Alkaline Phosphatase 52 38 - 126 U/L   Total Bilirubin 0.4 0.3 - 1.2 mg/dL   GFR calc non Af Amer 59 (L) >60 mL/min   GFR calc Af Amer >60 >60 mL/min    Comment: (NOTE) The eGFR has been calculated using the CKD EPI equation. This calculation has not been validated in all clinical situations. eGFR's persistently <60 mL/min signify possible Chronic Kidney Disease.    Anion gap 8 5 - 15  Urine Drug Screen, Qualitative ([facility] only)     Status: Abnormal   Collection Time: 01/11/16  9:18 PM  Result Value Ref Range   Tricyclic, Ur Screen POSITIVE (A) NONE DETECTED   Amphetamines, Ur Screen NONE DETECTED NONE DETECTED   MDMA (Ecstasy)Ur Screen POSITIVE (A) NONE DETECTED   Cocaine Metabolite,Ur [place] NONE DETECTED NONE DETECTED   Opiate, Ur Screen NONE DETECTED NONE DETECTED   Phencyclidine (PCP) Ur S POSITIVE (A) NONE DETECTED   Cannabinoid 50 Ng, Ur [place] NONE DETECTED NONE DETECTED   Barbiturates, Ur Screen NONE DETECTED NONE DETECTED   Benzodiazepine, Ur Scrn POSITIVE (A) NONE DETECTED   Methadone Scn, Ur NONE DETECTED NONE DETECTED    Comment: (NOTE) 010  Tricyclics, urine               Cutoff 1000 ng/mL 200  Amphetamines, urine             Cutoff 1000 ng/mL 300  MDMA (Ecstasy), urine           Cutoff 500 ng/mL 400  Cocaine Metabolite, urine       Cutoff 300 ng/mL 500  Opiate, urine                   Cutoff 300 ng/mL 600  Phencyclidine (PCP), urine      Cutoff 25 ng/mL 700  Cannabinoid, urine              Cutoff 50 ng/mL 800  Barbiturates, urine             Cutoff 200 ng/mL 900  Benzodiazepine, urine           Cutoff 200 ng/mL 1000 Methadone, urine                Cutoff 300 ng/mL 1100 1200 The urine drug screen provides only a preliminary, unconfirmed 1300 analytical test result and should not be used for non-medical 1400 purposes. Clinical consideration and professional judgment should 1500 be applied to any positive drug screen result  due to possible 1600 interfering substances. A more specific alternate chemical method 1700 must be used in order to obtain a confirmed analytical result.  1800 Gas chromato graphy / mass spectrometry (GC/MS) is the preferred 1900 confirmatory method.   Ethanol     Status: None   Collection Time: 01/11/16  9:18 PM  Result Value Ref Range   Alcohol, Ethyl (B) <5 <5 mg/dL    Comment:        LOWEST DETECTABLE LIMIT FOR SERUM ALCOHOL IS 5 mg/dL FOR MEDICAL PURPOSES ONLY   Acetaminophen level     Status: Abnormal   Collection Time: 01/11/16  9:18 PM  Result Value Ref Range   Acetaminophen (Tylenol), Serum <10 (L) 10 - 30 ug/mL    Comment:        THERAPEUTIC CONCENTRATIONS VARY SIGNIFICANTLY. A RANGE OF 10-30 ug/mL MAY BE AN EFFECTIVE CONCENTRATION FOR MANY  PATIENTS. HOWEVER, SOME ARE BEST TREATED AT CONCENTRATIONS OUTSIDE THIS RANGE. ACETAMINOPHEN CONCENTRATIONS >150 ug/mL AT 4 HOURS AFTER INGESTION AND >50 ug/mL AT 12 HOURS AFTER INGESTION ARE OFTEN ASSOCIATED WITH TOXIC REACTIONS.   Salicylate level     Status: None   Collection Time: 01/11/16  9:18 PM  Result Value Ref Range   Salicylate Lvl <1.7 2.8 - 30.0 mg/dL    Blood Alcohol level:  Lab Results  Component Value Date   ETH <5 01/11/2016    Physical Findings: AIMS:  , ,  ,  ,    CIWA:    COWS:     Musculoskeletal: Strength & Muscle Tone: spastic Gait & Station: normal Patient leans: N/A  Psychiatric Specialty Exam: ROS  Blood pressure 115/60, pulse 90, temperature 98.6 F (37 C), temperature source Oral, resp. rate 20, height 5' 4"  (1.626 m), weight 180 lb (81.647 kg), SpO2 100 %.Body mass index is 30.88 kg/(m^2).  General Appearance: Casual  Eye Contact::  Fair  Speech:  difficult to understand   Volume:  Decreased  Mood:  Anxious  Affect:  Constricted  Thought Process:  Disorganized  Orientation:  Full (Time, Place, and Person)  Thought Content:  WDL  Suicidal Thoughts:  No  Homicidal  Thoughts:  No  Memory:  Immediate;   Fair  Judgement:  Fair  Insight:  Lacking  Psychomotor Activity:  Psychomotor Retardation  Concentration:  Fair  Recall:  [employer] of Knowledge:Fair  Language: Fair  Akathisia:  No  Handed:  Right  AIMS (if indicated):     Assets:  Financial Resources/Insurance Housing Social Support  ADL's:  Intact  Cognition: WNL  Sleep:      Treatment Plan Summary: Medication management  Patient does not meet the criteria for inpatient psychiatric hospitalization He will be discharged from the emergency room and will return back to his group home.   no changes in his medications at this time. He will continue with his [place] for his medications Case discussed with the ED physician as well as the behavioral health staff They agreed with the plan  Thank you for allowing me to participate in the care of this patient     This note was generated in part or whole with voice recognition software. Voice regonition is usually quite accurate but there are transcription errors that can and very often do occur. I apologize for any typographical errors that were not detected and corrected.    Rainey Pines, MD 01/13/2016, 10:09 AM

## 2016-01-13 NOTE — ED Notes (Signed)
BEHAVIORAL HEALTH ROUNDING Patient sleeping: No. Patient alert and oriented: yes Behavior appropriate: Yes.  ; If no, describe:  Nutrition and fluids offered: Yes  Toileting and hygiene offered: Yes  Sitter present: no Law enforcement present: Yes  

## 2016-01-13 NOTE — ED Notes (Signed)
Patient is calm, resting quietly in bed. In no acute distress. Maintained on 15 minute checks and observation by security camera for safety.

## 2016-01-13 NOTE — ED Notes (Signed)
BEHAVIORAL HEALTH ROUNDING Patient sleeping: No. Patient alert and oriented: yes Behavior appropriate: Yes.  ; If no, describe:  Nutrition and fluids offered: Yes  Toileting and hygiene offered: Yes  Sitter present: not applicable Law enforcement present: Yes  

## 2016-01-13 NOTE — ED Notes (Signed)
Patient asleep in room. No noted distress or abnormal behavior. Will continue 15 minute checks and observation by security cameras for safety. Discharge planning in progress.

## 2016-01-13 NOTE — ED Notes (Signed)
Patient to be discharged back to group home.  Will call to arrange transport.

## 2016-01-25 ENCOUNTER — Encounter: Payer: Self-pay | Admitting: Emergency Medicine

## 2016-01-25 ENCOUNTER — Emergency Department
Admission: EM | Admit: 2016-01-25 | Discharge: 2016-01-25 | Disposition: A | Discharge status: Home / Self Care | Payer: Medicaid Other | Attending: Emergency Medicine | Admitting: Emergency Medicine

## 2016-01-25 DIAGNOSIS — Z79899 Other long term (current) drug therapy: Secondary | ICD-10-CM | POA: Diagnosis not present

## 2016-01-25 DIAGNOSIS — Y9389 Activity, other specified: Secondary | ICD-10-CM | POA: Diagnosis not present

## 2016-01-25 DIAGNOSIS — Y998 Other external cause status: Secondary | ICD-10-CM | POA: Diagnosis not present

## 2016-01-25 DIAGNOSIS — X58XXXA Exposure to other specified factors, initial encounter: Secondary | ICD-10-CM | POA: Diagnosis not present

## 2016-01-25 DIAGNOSIS — S8392XA Sprain of unspecified site of left knee, initial encounter: Secondary | ICD-10-CM | POA: Diagnosis not present

## 2016-01-25 DIAGNOSIS — Z792 Long term (current) use of antibiotics: Secondary | ICD-10-CM | POA: Insufficient documentation

## 2016-01-25 DIAGNOSIS — M25562 Pain in left knee: Secondary | ICD-10-CM | POA: Diagnosis present

## 2016-01-25 DIAGNOSIS — Y92009 Unspecified place in unspecified non-institutional (private) residence as the place of occurrence of the external cause: Secondary | ICD-10-CM | POA: Insufficient documentation

## 2016-01-25 MED ORDER — IBUPROFEN 400 MG PO TABS: 400.0000 mg | ORAL_TABLET | Freq: Three times a day (TID) | ORAL | Status: DC | PRN

## 2016-01-25 MED ORDER — IBUPROFEN 400 MG PO TABS: 400.0000 mg | ORAL_TABLET | Freq: Once | ORAL | Status: DC

## 2016-01-25 NOTE — ED Notes (Signed)
Pt states has left anterior knee pain, cms intact to all toes. Pt states no known injury, pain is present when he stands or flexes knee. No swelling or deformity noted. Pt denies calf pain.

## 2016-01-25 NOTE — ED Notes (Signed)
Pt declines ibuprofen. Group home notified they will need to pick pt up.

## 2016-01-25 NOTE — ED Provider Notes (Signed)
[facility] Emergency Department Provider Note  ____________________________________________  Time seen: On arrival  I have reviewed the triage vital signs and the nursing notes.   HISTORY  Chief Complaint Knee Pain    HPI Bradley Murphy is a 45 y.o. male who presents with complaints of left knee pain. Patient is from a group home and complained of left knee pain today so presented to the emergency department. No known injury. He has been ambulating well.    Past Medical History  Diagnosis Date  . Schizoaffective disorder (HCC)   . Thyroid disease   . Eczema   . Moderate mental retardation     Patient Active Problem List   Diagnosis Date Noted  . Severe episode of recurrent major depressive disorder, with psychotic features [facility])     Past Surgical History  Procedure Laterality Date  . Shoulder surgery Right   . Hand surgery Right     Current Outpatient Rx  Name  Route  Sig  Dispense  Refill  . ARIPiprazole (ABILIFY) 20 MG tablet   Oral   Take 40 mg by mouth at bedtime.         . benztropine (COGENTIN) 1 MG tablet   Oral   Take 1 mg by mouth 2 (two) times daily.         . Choline Fenofibrate (TRILIPIX) 135 MG capsule   Oral   Take 135 mg by mouth daily.         . divalproex (DEPAKOTE) 500 MG DR tablet   Oral   Take 1,000 mg by mouth at bedtime.         . docusate sodium (COLACE) 100 MG capsule   Oral   Take 100 mg by mouth daily.         Marland Kitchen ibuprofen (ADVIL,MOTRIN) 400 MG tablet   Oral   Take 1 tablet (400 mg total) by mouth every 8 (eight) hours as needed for moderate pain.   20 tablet   0   . ketoconazole (NIZORAL) 2 % shampoo   Topical   Apply 1 application topically 3 (three) times a week.         . levothyroxine (SYNTHROID, LEVOTHROID) 50 MCG tablet   Oral   Take 50 mcg by mouth daily before breakfast.         . LORazepam (ATIVAN) 1 MG tablet   Oral   Take 1 mg by mouth every 6 (six) hours.         . mirtazapine (REMERON) 45 MG tablet   Oral   Take 45 mg by mouth at bedtime.         Marland Kitchen PARoxetine (PAXIL) 40 MG tablet   Oral   Take 40 mg by mouth at bedtime.         . solifenacin (VESICARE) 5 MG tablet   Oral   Take 5 mg by mouth daily.         Marland Kitchen terbinafine (LAMISIL) 250 MG tablet   Oral   Take 250 mg by mouth daily.           Allergies Chocolate  No family history on file.  Social History Social History  Substance Use Topics  . Smoking status: Never Smoker   . Smokeless tobacco: Former Neurosurgeon  . Alcohol Use: No    Review of Systems    Musculoskeletal: Negative for back pain. Positive for left knee pain Skin: Negative for bruising    ____________________________________________   PHYSICAL EXAM:  VITAL SIGNS: ED Triage Vitals  Enc Vitals Group     BP 01/25/16 1808 128/70 mmHg     Pulse Rate 01/25/16 1808 105     Resp 01/25/16 1808 20     Temp 01/25/16 1808 97.9 F (36.6 C)     Temp Source 01/25/16 1808 Oral     SpO2 01/25/16 1808 95 %     Weight 01/25/16 1808 180 lb (81.647 kg)     Height 01/25/16 1808 5\' 4"  (1.626 m)     Head Cir --      Peak Flow --      Pain Score 01/25/16 1809 0     Pain Loc --      Pain Edu? --      Excl. in GC? --      Constitutional: Alert and oriented. Well appearing and in no distress.  Respiratory: Normal respiratory effort without tachypnea nor retractions.  Gastrointestinal: Soft and non-tender in all quadrants. No distention. There is no CVA tenderness. Musculoskeletal: Nontender with normal range of motion in all extremities. Patient has mild medial swelling of the left knee but no tenderness to palpation. Full range of motion without distress. Ambulating well. 2+distal pulses. Neurologic:  Normal speech and language. No gross focal neurologic deficits are appreciated. Skin:  Skin is warm, dry and intact. No rash noted.   ____________________________________________    LABS (pertinent  positives/negatives)  Labs Reviewed - No data to display  ____________________________________________     ____________________________________________    RADIOLOGY I have personally reviewed any xrays that were ordered on this patient: X-ray none indicated  ____________________________________________   PROCEDURES  Procedure(s) performed: none   ____________________________________________   INITIAL IMPRESSION / ASSESSMENT AND PLAN / ED COURSE  Pertinent labs & imaging results that were available during my care of the patient were reviewed by me and considered in my medical decision making (see chart for details).  Patient well-appearing ambulating on knee without difficulty. Relatively benign exam. Suspect mild knee sprain, recommend supportive care, Rice ____________________________________________   FINAL CLINICAL IMPRESSION(S) / ED DIAGNOSES  Final diagnoses:  Knee sprain, left, initial encounter     Jene Everyobert Jordie Skalsky, MD 01/25/16 2320

## 2016-01-25 NOTE — Discharge Instructions (Signed)

## 2016-01-25 NOTE — ED Notes (Signed)
States L knee pain today, denies fall, states is from group home. Can't state name of group home.

## 2016-01-25 NOTE — ED Notes (Signed)
Patient from [facility], information on demographics page to contact when patient ready to go home.

## 2017-02-07 ENCOUNTER — Emergency Department
Admission: EM | Admit: 2017-02-07 | Discharge: 2017-02-07 | Disposition: A | Discharge status: Home / Self Care | Payer: Medicare Other | Attending: Emergency Medicine | Admitting: Emergency Medicine

## 2017-02-07 ENCOUNTER — Encounter: Payer: Self-pay | Admitting: Emergency Medicine

## 2017-02-07 DIAGNOSIS — Z79899 Other long term (current) drug therapy: Secondary | ICD-10-CM | POA: Diagnosis not present

## 2017-02-07 DIAGNOSIS — F259 Schizoaffective disorder, unspecified: Secondary | ICD-10-CM | POA: Insufficient documentation

## 2017-02-07 DIAGNOSIS — R451 Restlessness and agitation: Secondary | ICD-10-CM | POA: Diagnosis present

## 2017-02-07 DIAGNOSIS — Z87891 Personal history of nicotine dependence: Secondary | ICD-10-CM | POA: Diagnosis not present

## 2017-02-07 DIAGNOSIS — R4689 Other symptoms and signs involving appearance and behavior: Secondary | ICD-10-CM

## 2017-02-07 LAB — COMPREHENSIVE METABOLIC PANEL
ALT: 22 U/L (ref 17–63)
ANION GAP: 12 (ref 5–15)
AST: 37 U/L (ref 15–41)
Albumin: 3.8 g/dL (ref 3.5–5.0)
Alkaline Phosphatase: 58 U/L (ref 38–126)
BUN: 14 mg/dL (ref 6–20)
CALCIUM: 9.2 mg/dL (ref 8.9–10.3)
CHLORIDE: 102 mmol/L (ref 101–111)
CO2: 24 mmol/L (ref 22–32)
CREATININE: 0.95 mg/dL (ref 0.61–1.24)
Glucose, Bld: 282 mg/dL — ABNORMAL HIGH (ref 65–99)
Potassium: 3.5 mmol/L (ref 3.5–5.1)
SODIUM: 138 mmol/L (ref 135–145)
Total Bilirubin: 0.8 mg/dL (ref 0.3–1.2)
Total Protein: 7.8 g/dL (ref 6.5–8.1)

## 2017-02-07 LAB — CBC
HCT: 39.1 % — ABNORMAL LOW (ref 40.0–52.0)
Hemoglobin: 13.7 g/dL (ref 13.0–18.0)
MCH: 29.6 pg (ref 26.0–34.0)
MCHC: 35 g/dL (ref 32.0–36.0)
MCV: 84.3 fL (ref 80.0–100.0)
Platelets: 133 10*3/uL — ABNORMAL LOW (ref 150–440)
RBC: 4.64 MIL/uL (ref 4.40–5.90)
RDW: 13.5 % (ref 11.5–14.5)
WBC: 4.5 10*3/uL (ref 3.8–10.6)

## 2017-02-07 LAB — SALICYLATE LEVEL

## 2017-02-07 LAB — ACETAMINOPHEN LEVEL

## 2017-02-07 LAB — VALPROIC ACID LEVEL: VALPROIC ACID LVL: 19 ug/mL — AB (ref 50.0–100.0)

## 2017-02-07 LAB — ETHANOL

## 2017-02-07 MED ORDER — SODIUM CHLORIDE 0.9 % IV BOLUS (SEPSIS)
1000.0000 mL | Freq: Once | INTRAVENOUS | Status: AC
  Administered 2017-02-07: 1000 mL via INTRAVENOUS

## 2017-02-07 MED ORDER — LORAZEPAM 1 MG PO TABS
1.0000 mg | ORAL_TABLET | Freq: Once | ORAL | Status: AC
  Administered 2017-02-07: 1 mg via ORAL
  Filled 2017-02-07: qty 1

## 2017-02-07 NOTE — ED Notes (Signed)
personal belongings obtained; pt dressed back into personal clothing
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
## 2017-02-07 NOTE — Discharge Instructions (Signed)
Please seek medical attention and help for any thoughts about wanting to harm yourself, harm others, any concerning change in behavior, severe depression, inappropriate drug use or any other new or concerning symptoms. ° °

## 2018-04-14 DIAGNOSIS — F71 Moderate intellectual disabilities: Secondary | ICD-10-CM | POA: Insufficient documentation

## 2018-04-22 DIAGNOSIS — Z6830 Body mass index (BMI) 30.0-30.9, adult: Secondary | ICD-10-CM | POA: Insufficient documentation

## 2018-07-26 ENCOUNTER — Emergency Department (HOSPITAL_COMMUNITY)
Admission: EM | Admit: 2018-07-26 | Discharge: 2018-07-26 | Disposition: A | Discharge status: Home / Self Care | Payer: Medicare Other | Attending: Emergency Medicine | Admitting: Emergency Medicine

## 2018-07-26 ENCOUNTER — Encounter (HOSPITAL_COMMUNITY): Payer: Self-pay

## 2018-07-26 DIAGNOSIS — R197 Diarrhea, unspecified: Secondary | ICD-10-CM | POA: Diagnosis not present

## 2018-07-26 DIAGNOSIS — D649 Anemia, unspecified: Secondary | ICD-10-CM | POA: Diagnosis not present

## 2018-07-26 DIAGNOSIS — R809 Proteinuria, unspecified: Secondary | ICD-10-CM | POA: Insufficient documentation

## 2018-07-26 DIAGNOSIS — R7989 Other specified abnormal findings of blood chemistry: Secondary | ICD-10-CM | POA: Insufficient documentation

## 2018-07-26 DIAGNOSIS — E039 Hypothyroidism, unspecified: Secondary | ICD-10-CM | POA: Insufficient documentation

## 2018-07-26 DIAGNOSIS — R112 Nausea with vomiting, unspecified: Secondary | ICD-10-CM | POA: Diagnosis not present

## 2018-07-26 DIAGNOSIS — R51 Headache: Secondary | ICD-10-CM | POA: Insufficient documentation

## 2018-07-26 DIAGNOSIS — R319 Hematuria, unspecified: Secondary | ICD-10-CM | POA: Diagnosis not present

## 2018-07-26 LAB — CBC WITH DIFFERENTIAL/PLATELET
BASOS ABS: 0 10*3/uL (ref 0.0–0.1)
BASOS PCT: 0 %
EOS ABS: 0 10*3/uL (ref 0.0–0.7)
EOS PCT: 1 %
HCT: 36.7 % — ABNORMAL LOW (ref 39.0–52.0)
Hemoglobin: 11.8 g/dL — ABNORMAL LOW (ref 13.0–17.0)
LYMPHS ABS: 1 10*3/uL (ref 0.7–4.0)
Lymphocytes Relative: 23 %
MCH: 27.8 pg (ref 26.0–34.0)
MCHC: 32.2 g/dL (ref 30.0–36.0)
MCV: 86.6 fL (ref 78.0–100.0)
Monocytes Absolute: 0.3 10*3/uL (ref 0.1–1.0)
Monocytes Relative: 8 %
NEUTROS PCT: 68 %
Neutro Abs: 2.8 10*3/uL (ref 1.7–7.7)
PLATELETS: 129 10*3/uL — AB (ref 150–400)
RBC: 4.24 MIL/uL (ref 4.22–5.81)
RDW: 13.8 % (ref 11.5–15.5)
WBC: 4.1 10*3/uL (ref 4.0–10.5)

## 2018-07-26 LAB — COMPREHENSIVE METABOLIC PANEL
ALK PHOS: 39 U/L (ref 38–126)
ALT: 18 U/L (ref 0–44)
AST: 29 U/L (ref 15–41)
Albumin: 3.5 g/dL (ref 3.5–5.0)
Anion gap: 10 (ref 5–15)
BILIRUBIN TOTAL: 0.5 mg/dL (ref 0.3–1.2)
BUN: 19 mg/dL (ref 6–20)
CALCIUM: 9.2 mg/dL (ref 8.9–10.3)
CO2: 29 mmol/L (ref 22–32)
CREATININE: 1.57 mg/dL — AB (ref 0.61–1.24)
Chloride: 104 mmol/L (ref 98–111)
GFR, EST AFRICAN AMERICAN: 59 mL/min — AB (ref >60)
GFR, EST NON AFRICAN AMERICAN: 51 mL/min — AB (ref >60)
Glucose, Bld: 232 mg/dL — ABNORMAL HIGH (ref 70–99)
Potassium: 3.9 mmol/L (ref 3.5–5.1)
Sodium: 143 mmol/L (ref 135–145)
TOTAL PROTEIN: 7.3 g/dL (ref 6.5–8.1)

## 2018-07-26 LAB — URINALYSIS, ROUTINE W REFLEX MICROSCOPIC
BILIRUBIN URINE: NEGATIVE
GLUCOSE, UA: 50 mg/dL — AB
KETONES UR: NEGATIVE mg/dL
Leukocytes, UA: NEGATIVE
Nitrite: NEGATIVE
PH: 5 (ref 5.0–8.0)
Protein, ur: 100 mg/dL — AB
SPECIFIC GRAVITY, URINE: 1.017 (ref 1.005–1.030)

## 2018-07-26 LAB — LIPASE, BLOOD: LIPASE: 54 U/L — AB (ref 11–51)

## 2018-07-26 MED ORDER — ONDANSETRON HCL 4 MG/2ML IJ SOLN
4.0000 mg | Freq: Once | INTRAMUSCULAR | Status: AC
  Administered 2018-07-26: 4 mg via INTRAVENOUS
  Filled 2018-07-26: qty 2

## 2018-07-26 MED ORDER — SODIUM CHLORIDE 0.9 % IV BOLUS
1000.0000 mL | Freq: Once | INTRAVENOUS | Status: AC
  Administered 2018-07-26: 1000 mL via INTRAVENOUS

## 2018-07-26 NOTE — ED Notes (Signed)
Pt picked up by Roma Kayser

## 2018-07-26 NOTE — ED Notes (Signed)
Patient given crackers and water

## 2018-07-26 NOTE — ED Notes (Signed)
Bed: [facility] Expected date:  Expected time:  Means of arrival:  Comments: 

## 2018-07-26 NOTE — ED Notes (Signed)
Patient assisted to the bathroom on a steady. Patient had 1 occurrence of diarrhea. Patients pants put in belongings bag at bedside. Blue scrub pants placed on patient.

## 2018-07-26 NOTE — Discharge Instructions (Addendum)
You were seen in the emergency department today for nausea, vomiting, and diarrhea.  You had some abnormalities with your labs which will need to be rechecked by her primary care provider, this includes that you had some blood and protein in your urine, your kidney function (creatinine (was a little bit elevated, be sure to drink plenty of fluids with this, and your hemoglobin was a bit low at 11.8.  We would like you to discuss each of these findings with your primary care provider with a recheck of your labs within 1 week.  Return to the ER for new or worsening symptoms including but not limited to fever, abdominal pain, inability to keep fluids down, blood in your stool, or any other concerns that you may have.

## 2018-07-26 NOTE — ED Notes (Addendum)
Day care program is up at 3. Patient needs to go back to group home ([facility]) when discharged. If any problems contact falecia CNA on 5E. Change emergency contact info to [facility].

## 2018-07-26 NOTE — ED Notes (Signed)
Called [facility] for transportation  

## 2018-07-26 NOTE — ED Notes (Addendum)
Spoke to [employer] [phone]. Care giver aware patient is coming back. Vince noted he may come get him.

## 2018-07-26 NOTE — ED Notes (Signed)
Urinal at bedside. Encouraged patient to urinate or call out if needed help. Patient unable to void at this time.

## 2018-07-26 NOTE — ED Triage Notes (Addendum)
Patient arrived via [facility] from Adult day care  ([address] Went out to lunch for [employer]. Patient ate 7 slices of pizza and is now experiencing n/v and a headache. Staff states patient vomited twice before ems arrived. Patient dry heaved with ems. A/Ox4. Patient is at his baseline. Patients Guardian is Vince. EMS spoke with him on the phone. See 2nd note.

## 2018-07-26 NOTE — ED Provider Notes (Signed)
[facility]-EMERGENCY DEPT Provider Note   CSN: [id number] Arrival date & time: 07/26/18  1408     History   Chief Complaint Chief Complaint  Patient presents with  . Nausea  . Emesis    HPI Bradley Murphy is a 47 y.o. male with a hx of moderate MR, schizoaffective disorder, and hypothyroidism who arrives to the ED via EMS from adult daycare with complaints of nausea, vomiting, and headache which started shortly prior to arrival. Per EMS report to triage team patient went out to lunch at a pizza buffet and consumed 7 slices of pizza with resultant nausea, vomiting x 2, and headache. Patient dry heaved with EMS. Noted to have diarrhea in the ED by nursing. Patient reports to me he "ate too much pizza and it made him sick." Reports 2 episodes of emesis and 2-3 episodes of non-bloody diarrhea. States that he feels a bit lightheaded/dizzy after vomiting. No specific alleviating/aggravating factors. No meds PTA. Headache has resolved upon my evaluation. Denies abdominal pain, fever, chest pain, dypspnea, focal weakness/numbness, dysuria, or testicular pain/swelling.   EMS spoke with patient's reported legal guardian Vince  HPI  Past Medical History:  Diagnosis Date  . Eczema   . Moderate mental retardation   . Schizoaffective disorder (HCC)   . Thyroid disease     Patient Active Problem List   Diagnosis Date Noted  . Severe episode of recurrent major depressive disorder, with psychotic features [facility])     Past Surgical History:  Procedure Laterality Date  . HAND SURGERY Right   . shoulder surgery Right         Home Medications    Prior to Admission medications   Medication Sig Start Date End Date Taking? Authorizing Provider  ARIPiprazole (ABILIFY) 20 MG tablet Take 40 mg by mouth at bedtime.    [provider]  benztropine (COGENTIN) 1 MG tablet Take 1 mg by mouth 2 (two) times daily.    [provider]  Choline Fenofibrate  (TRILIPIX) 135 MG capsule Take 135 mg by mouth daily.    [provider]  divalproex (DEPAKOTE) 500 MG DR tablet Take 1,000 mg by mouth at bedtime.    [provider]  docusate sodium (COLACE) 100 MG capsule Take 100 mg by mouth daily.    [provider]  ibuprofen (ADVIL,MOTRIN) 400 MG tablet Take 1 tablet (400 mg total) by mouth every 8 (eight) hours as needed for moderate pain. 01/25/16   Jene Every, MD  ketoconazole (NIZORAL) 2 % shampoo Apply 1 application topically 3 (three) times a week.    [provider]  levothyroxine (SYNTHROID, LEVOTHROID) 50 MCG tablet Take 50 mcg by mouth daily before breakfast.    [provider]  LORazepam (ATIVAN) 1 MG tablet Take 1 mg by mouth every 6 (six) hours.    [provider]  mirtazapine (REMERON) 45 MG tablet Take 45 mg by mouth at bedtime.    [provider]  PARoxetine (PAXIL) 40 MG tablet Take 40 mg by mouth at bedtime.    [provider]  solifenacin (VESICARE) 5 MG tablet Take 5 mg by mouth daily.    [provider]  terbinafine (LAMISIL) 250 MG tablet Take 250 mg by mouth daily.    [provider]    Family History History reviewed. No pertinent family history.  Social History Social History   Tobacco Use  . Smoking status: Never Smoker  . Smokeless tobacco: Former Engineer, water  Use Topics  . Alcohol use: No  . Drug use: Not on file     Allergies   Chocolate   Review of Systems Review of Systems  Constitutional: Negative for fever.  Respiratory: Negative for shortness of breath.   Cardiovascular: Negative for chest pain.  Gastrointestinal: Positive for diarrhea, nausea and vomiting. Negative for abdominal pain, blood in stool and constipation.  Genitourinary: Negative for dysuria, scrotal swelling and testicular pain.  Neurological: Positive for dizziness, light-headedness and headaches (resolved at present). Negative for syncope,  weakness and numbness.  All other systems reviewed and are negative.  Physical Exam Updated Vital Signs BP 119/82 (BP Location: Left Arm)   Pulse (!) 112   Temp 98.9 F (37.2 C) (Oral)   Resp 17   SpO2 97%   Physical Exam  Constitutional: He appears well-developed and well-nourished.  Non-toxic appearance. No distress.  HENT:  Head: Normocephalic and atraumatic.  Mouth/Throat: Mucous membranes are dry.  Eyes: Conjunctivae are normal. Right eye exhibits no discharge. Left eye exhibits no discharge.  Neck: Neck supple.  Cardiovascular: Regular rhythm. Tachycardia present.  No murmur heard. Pulmonary/Chest: Effort normal and breath sounds normal. No respiratory distress. He has no wheezes. He has no rhonchi. He has no rales.  Respiration even and unlabored  Abdominal: Soft. Bowel sounds are normal. He exhibits no distension. There is tenderness (mild epigastrium/periumbilical area). There is no rigidity, no rebound, no guarding, no CVA tenderness, no tenderness at McBurney's point and negative Murphy's sign.  Neurological: He is alert.  Clear speech.   Skin: Skin is warm and dry. No rash noted.  Psychiatric: He has a normal mood and affect. His behavior is normal.  Nursing note and vitals reviewed.    ED Treatments / Results  Labs (all labs ordered are listed, but only abnormal results are displayed) Labs Reviewed  COMPREHENSIVE METABOLIC PANEL - Abnormal; Notable for the following components:      Result Value   Glucose, Bld 232 (*)    Creatinine, Ser 1.57 (*)    GFR calc non Af Amer 51 (*)    GFR calc Af Amer 59 (*)    All other components within normal limits  CBC WITH DIFFERENTIAL/PLATELET - Abnormal; Notable for the following components:   Hemoglobin 11.8 (*)    HCT 36.7 (*)    Platelets 129 (*)    All other components within normal limits  LIPASE, BLOOD - Abnormal; Notable for the following components:   Lipase 54 (*)    All other components within normal limits    URINALYSIS, ROUTINE W REFLEX MICROSCOPIC - Abnormal; Notable for the following components:   Glucose, UA 50 (*)    Hgb urine dipstick LARGE (*)    Protein, ur 100 (*)    Bacteria, UA RARE (*)    All other components within normal limits    EKG None  Radiology No results found.  Procedures Procedures (including critical care time)  Medications Ordered in ED Medications  ondansetron (ZOFRAN) injection 4 mg (4 mg Intravenous Given 07/26/18 1534)  sodium chloride 0.9 % bolus 1,000 mL (0 mLs Intravenous Stopped 07/26/18 1735)    Initial Impression / Assessment and Plan / ED Course  I have reviewed the triage vital signs and the nursing notes.  Pertinent labs & imaging results that were available during my care of the patient were reviewed by me and considered in my medical decision making (see chart for details).   Patient presented to the emergency department  with N/V/D after consuming 7 slices of pizza, after N/V/D developed some mild lightheadedness, no associated abdominal pain.  Patient nontoxic-appearing, resting comfortably, vitals notable for mild tachycardia.  Exam fairly benign with some mild epigastric tenderness, no peritoneal signs. Further evaluated with labs: No leukocytosis.  Hemoglobin of 11.8 is somewhat decreased from prior on record, this will require PCP recheck.  Thrombocytopenia at 129 appears at baseline.  No significant electrolyte disturbances.  Patient's creatinine is elevated at 1.57, prior ranging 0.95-1.37-patient receiving IV hydration in the emergency department, will also require recheck.  LFTs WNL.  Lipase minimally elevated do not suspect acute pancreatitis.  Patient's urine does not appear consistent with overt infection, he does have proteinuria as well as hematuria, again will require recheck.  On repeat exam patient remains without peritoneal signs do not suspect acute cholecystitis, pancreatitis, diverticulitis, bowel obstruction/perforation, appendicitis.   Patient feeling improved following IV fluids and Zofran, he is tolerating PO, tachycardia resolved. I discussed results, treatment plan, need for PCP follow-up, and return precautions with the patient. Provided opportunity for questions, patient confirmed understanding and is in agreement with plan. Also discussed with group home staff.   Findings and plan of care discussed with supervising physician Dr. Anitra Lauth who personally evaluated and examined this patient and is in agreement.   Vitals:   07/26/18 1757 07/26/18 1941  BP: 122/85 126/74  Pulse: 97 77  Resp: 18 14  Temp:    SpO2: 97% 98%    Final Clinical Impressions(s) / ED Diagnoses   Final diagnoses:  Nausea vomiting and diarrhea  Elevated serum creatinine  Proteinuria, unspecified type  Hematuria, unspecified type  Anemia, unspecified type    ED Discharge Orders    None       Cherly Anderson, PA-C 07/27/18 0014    Gwyneth Sprout, MD 07/27/18 [phone]

## 2018-12-13 ENCOUNTER — Encounter: Payer: Self-pay | Admitting: Podiatry

## 2018-12-13 ENCOUNTER — Ambulatory Visit (INDEPENDENT_AMBULATORY_CARE_PROVIDER_SITE_OTHER): Payer: Medicare Other | Admitting: Podiatry

## 2018-12-13 ENCOUNTER — Other Ambulatory Visit: Payer: Self-pay

## 2018-12-13 VITALS — BP 137/84 | HR 101 | Resp 16

## 2018-12-13 DIAGNOSIS — G2401 Drug induced subacute dyskinesia: Secondary | ICD-10-CM | POA: Insufficient documentation

## 2018-12-13 DIAGNOSIS — E1151 Type 2 diabetes mellitus with diabetic peripheral angiopathy without gangrene: Secondary | ICD-10-CM | POA: Diagnosis not present

## 2018-12-13 DIAGNOSIS — B351 Tinea unguium: Secondary | ICD-10-CM

## 2018-12-13 DIAGNOSIS — E119 Type 2 diabetes mellitus without complications: Secondary | ICD-10-CM

## 2018-12-13 NOTE — Progress Notes (Signed)
Subjective:  Patient ID: Bradley Murphy, male    DOB: 1971-07-24,  MRN: [id number]  Chief Complaint  Patient presents with  . debride    BL nail trimmming -pt states he's diabetic but does not check his sugar     48 y.o. male presents  for diabetic foot care. Last AMBS was unknown. Reports numbness and tingling in their feet. Denies cramping in legs and thighs.  Review of Systems: Negative except as noted in the HPI. Denies N/V/F/Ch.  Past Medical History:  Diagnosis Date  . Eczema   . Moderate mental retardation   . Schizoaffective disorder (HCC)   . Thyroid disease     Current Outpatient Medications:  .  amantadine (SYMMETREL) 100 MG capsule, Take by mouth., Disp: , Rfl:  .  ARIPiprazole (ABILIFY) 20 MG tablet, Take 40 mg by mouth at bedtime., Disp: , Rfl:  .  benztropine (COGENTIN) 1 MG tablet, Take 1 mg by mouth 2 (two) times daily., Disp: , Rfl:  .  Choline Fenofibrate (TRILIPIX) 135 MG capsule, Take 135 mg by mouth daily., Disp: , Rfl:  .  divalproex (DEPAKOTE) 500 MG DR tablet, Take 1,000 mg by mouth at bedtime., Disp: , Rfl:  .  docusate sodium (COLACE) 100 MG capsule, Take 100 mg by mouth daily., Disp: , Rfl:  .  hydrOXYzine (ATARAX/VISTARIL) 25 MG tablet, Take one (1) tablet by mouth three times a day, Disp: , Rfl:  .  ibuprofen (ADVIL,MOTRIN) 400 MG tablet, Take 1 tablet (400 mg total) by mouth every 8 (eight) hours as needed for moderate pain., Disp: 20 tablet, Rfl: 0 .  ketoconazole (NIZORAL) 2 % shampoo, Apply 1 application topically 3 (three) times a week., Disp: , Rfl:  .  levothyroxine (SYNTHROID, LEVOTHROID) 50 MCG tablet, Take 50 mcg by mouth daily before breakfast., Disp: , Rfl:  .  LORazepam (ATIVAN) 1 MG tablet, Take 1 mg by mouth every 6 (six) hours., Disp: , Rfl:  .  mirtazapine (REMERON) 45 MG tablet, Take 45 mg by mouth at bedtime., Disp: , Rfl:  .  PARoxetine (PAXIL) 40 MG tablet, Take 40 mg by mouth at bedtime., Disp: , Rfl:  .  solifenacin  (VESICARE) 5 MG tablet, Take 5 mg by mouth daily., Disp: , Rfl:  .  terbinafine (LAMISIL) 250 MG tablet, Take 250 mg by mouth daily., Disp: , Rfl:   Social History   Tobacco Use  Smoking Status Never Smoker  Smokeless Tobacco Former Neurosurgeon    Allergies  Allergen Reactions  . Chocolate     Per group home records  . Chocolate Flavor Other (See Comments)   Objective:   Vitals:   12/13/18 0959  BP: 137/84  Pulse: (!) 101  Resp: 16   There is no height or weight on file to calculate BMI. Constitutional Well developed. Well nourished.  Vascular Dorsalis pedis pulses present 1+ bilaterally  Posterior tibial pulses absent bilaterally  Pedal hair growth diminished. Capillary refill normal to all digits.  No cyanosis or clubbing noted.  Neurologic Normal speech. Oriented to person, place, and time. Epicritic sensation to light touch grossly present bilaterally. Protective sensation with 5.07 monofilament  present bilaterally. Vibratory sensation present bilaterally.  Dermatologic Nails elongated, thickened, dystrophic. No open wounds. No skin lesions.  Orthopedic: Normal joint ROM without pain or crepitus bilaterally. No visible deformities. No bony tenderness.   Assessment:   1. Encounter for diabetic foot exam (HCC)   2. Diabetes mellitus type 2 with peripheral artery disease (HCC)  3. Onychomycosis    Plan:  Patient was evaluated and treated and all questions answered.  Diabetes with PAD, Onychomycosis -Educated on diabetic footcare. Diabetic risk level 1 -Nails x10 debrided sharply and manually with large nail nipper and rotary burr.   Procedure: Nail Debridement Rationale: Patient meets criteria for routine foot care due to PAD Type of Debridement: manual, sharp debridement. Instrumentation: Nail nipper, rotary burr. Number of Nails: 10   No follow-ups on file.

## 2018-12-13 NOTE — Progress Notes (Signed)
   Subjective:    Patient ID: Bradley Murphy, male    DOB: 1971-09-08, 48 y.o.   MRN: [id number]  HPI    Review of Systems  Neurological: Positive for speech difficulty.  Psychiatric/Behavioral: Positive for decreased concentration and dysphoric mood.  All other systems reviewed and are negative.      Objective:   Physical Exam        Assessment & Plan:

## 2019-03-14 ENCOUNTER — Ambulatory Visit (INDEPENDENT_AMBULATORY_CARE_PROVIDER_SITE_OTHER): Payer: Medicare Other | Admitting: Podiatry

## 2019-03-14 ENCOUNTER — Encounter: Payer: Self-pay | Admitting: Podiatry

## 2019-03-14 ENCOUNTER — Other Ambulatory Visit: Payer: Self-pay

## 2019-03-14 VITALS — Temp 97.6°F | Resp 16

## 2019-03-14 DIAGNOSIS — B351 Tinea unguium: Secondary | ICD-10-CM

## 2019-03-14 DIAGNOSIS — M79609 Pain in unspecified limb: Principal | ICD-10-CM

## 2019-03-14 DIAGNOSIS — M79676 Pain in unspecified toe(s): Secondary | ICD-10-CM | POA: Diagnosis not present

## 2019-03-14 NOTE — Progress Notes (Signed)
Subjective:  Patient ID: Bradley Murphy, male    DOB: Feb 06, 1971,  MRN: [id number]  Chief Complaint  Patient presents with  . debride    BL nail trimming -FBS: unkown -pt denis any N/V/F/Ch     48 y.o. male presents with the above complaint.  Reports painfully elongated nails to both feet.  Review of Systems: Negative except as noted in the HPI. Denies N/V/F/Ch.  Past Medical History:  Diagnosis Date  . Eczema   . Moderate mental retardation   . Schizoaffective disorder (HCC)   . Thyroid disease     Current Outpatient Medications:  .  amantadine (SYMMETREL) 100 MG capsule, Take by mouth., Disp: , Rfl:  .  ARIPiprazole (ABILIFY) 20 MG tablet, Take 40 mg by mouth at bedtime., Disp: , Rfl:  .  benztropine (COGENTIN) 1 MG tablet, Take 1 mg by mouth 2 (two) times daily., Disp: , Rfl:  .  Choline Fenofibrate (TRILIPIX) 135 MG capsule, Take 135 mg by mouth daily., Disp: , Rfl:  .  divalproex (DEPAKOTE) 500 MG DR tablet, Take 1,000 mg by mouth at bedtime., Disp: , Rfl:  .  docusate sodium (COLACE) 100 MG capsule, Take 100 mg by mouth daily., Disp: , Rfl:  .  hydrOXYzine (ATARAX/VISTARIL) 25 MG tablet, Take one (1) tablet by mouth three times a day, Disp: , Rfl:  .  ibuprofen (ADVIL,MOTRIN) 400 MG tablet, Take 1 tablet (400 mg total) by mouth every 8 (eight) hours as needed for moderate pain., Disp: 20 tablet, Rfl: 0 .  ketoconazole (NIZORAL) 2 % shampoo, Apply 1 application topically 3 (three) times a week., Disp: , Rfl:  .  levothyroxine (SYNTHROID, LEVOTHROID) 50 MCG tablet, Take 50 mcg by mouth daily before breakfast., Disp: , Rfl:  .  LORazepam (ATIVAN) 1 MG tablet, Take 1 mg by mouth every 6 (six) hours., Disp: , Rfl:  .  mirtazapine (REMERON) 45 MG tablet, Take 45 mg by mouth at bedtime., Disp: , Rfl:  .  PARoxetine (PAXIL) 40 MG tablet, Take 40 mg by mouth at bedtime., Disp: , Rfl:  .  solifenacin (VESICARE) 5 MG tablet, Take 5 mg by mouth daily., Disp: , Rfl:  .  terbinafine  (LAMISIL) 250 MG tablet, Take 250 mg by mouth daily., Disp: , Rfl:   Social History   Tobacco Use  Smoking Status Never Smoker  Smokeless Tobacco Former Neurosurgeon    Allergies  Allergen Reactions  . Chocolate     Per group home records  . Chocolate Flavor Other (See Comments)   Objective:   Vitals:   03/14/19 1025  Resp: 16  Temp: 97.6 F (36.4 C)   There is no height or weight on file to calculate BMI. Constitutional Well developed. Well nourished.  Vascular Dorsalis pedis pulses palpable bilaterally. Posterior tibial pulses palpable bilaterally. Capillary refill normal to all digits.  No cyanosis or clubbing noted. Pedal hair growth normal.  Neurologic Normal speech. Oriented to person, place, and time. Epicritic sensation to light touch grossly present bilaterally.  Dermatologic Nails elongated dystrophic pain to palpation No open wounds. No skin lesions.  Orthopedic: Normal joint ROM without pain or crepitus bilaterally. No visible deformities. No bony tenderness.   Radiographs: None Assessment:   1. Pain due to onychomycosis of nail    Plan:  Patient was evaluated and treated and all questions answered.  Onychomycosis with pain -Nails palliatively debridement as below -Educated on self-care  Procedure: Nail Debridement Rationale: Pain Type of Debridement: manual, sharp debridement.  Instrumentation: Nail nipper, rotary burr. Number of Nails: 10    Return if symptoms worsen or fail to improve.

## 2020-03-12 ENCOUNTER — Encounter (HOSPITAL_BASED_OUTPATIENT_CLINIC_OR_DEPARTMENT_OTHER): Payer: Self-pay | Admitting: Emergency Medicine

## 2020-03-12 ENCOUNTER — Other Ambulatory Visit: Payer: Self-pay

## 2020-03-12 ENCOUNTER — Emergency Department (HOSPITAL_BASED_OUTPATIENT_CLINIC_OR_DEPARTMENT_OTHER): Payer: Medicare Other

## 2020-03-12 ENCOUNTER — Inpatient Hospital Stay (HOSPITAL_BASED_OUTPATIENT_CLINIC_OR_DEPARTMENT_OTHER)
Admission: EM | Admit: 2020-03-12 | Discharge: 2020-03-16 | DRG: 683 | Disposition: A | Discharge status: Home / Self Care | Payer: Medicare Other | Attending: Internal Medicine | Admitting: Internal Medicine

## 2020-03-12 DIAGNOSIS — N189 Chronic kidney disease, unspecified: Secondary | ICD-10-CM | POA: Diagnosis not present

## 2020-03-12 DIAGNOSIS — Z91018 Allergy to other foods: Secondary | ICD-10-CM | POA: Diagnosis not present

## 2020-03-12 DIAGNOSIS — R42 Dizziness and giddiness: Secondary | ICD-10-CM | POA: Diagnosis present

## 2020-03-12 DIAGNOSIS — F259 Schizoaffective disorder, unspecified: Secondary | ICD-10-CM | POA: Diagnosis present

## 2020-03-12 DIAGNOSIS — R000 Tachycardia, unspecified: Secondary | ICD-10-CM | POA: Diagnosis present

## 2020-03-12 DIAGNOSIS — R0789 Other chest pain: Secondary | ICD-10-CM | POA: Diagnosis present

## 2020-03-12 DIAGNOSIS — E869 Volume depletion, unspecified: Secondary | ICD-10-CM | POA: Diagnosis present

## 2020-03-12 DIAGNOSIS — D631 Anemia in chronic kidney disease: Secondary | ICD-10-CM | POA: Diagnosis present

## 2020-03-12 DIAGNOSIS — Z79890 Hormone replacement therapy (postmenopausal): Secondary | ICD-10-CM

## 2020-03-12 DIAGNOSIS — D61818 Other pancytopenia: Secondary | ICD-10-CM | POA: Diagnosis present

## 2020-03-12 DIAGNOSIS — N183 Chronic kidney disease, stage 3 unspecified: Secondary | ICD-10-CM | POA: Diagnosis not present

## 2020-03-12 DIAGNOSIS — I129 Hypertensive chronic kidney disease with stage 1 through stage 4 chronic kidney disease, or unspecified chronic kidney disease: Secondary | ICD-10-CM | POA: Diagnosis present

## 2020-03-12 DIAGNOSIS — E039 Hypothyroidism, unspecified: Secondary | ICD-10-CM | POA: Diagnosis present

## 2020-03-12 DIAGNOSIS — A084 Viral intestinal infection, unspecified: Secondary | ICD-10-CM | POA: Diagnosis present

## 2020-03-12 DIAGNOSIS — R079 Chest pain, unspecified: Secondary | ICD-10-CM | POA: Diagnosis not present

## 2020-03-12 DIAGNOSIS — I313 Pericardial effusion (noninflammatory): Secondary | ICD-10-CM | POA: Diagnosis present

## 2020-03-12 DIAGNOSIS — D696 Thrombocytopenia, unspecified: Secondary | ICD-10-CM | POA: Diagnosis not present

## 2020-03-12 DIAGNOSIS — N1832 Chronic kidney disease, stage 3b: Secondary | ICD-10-CM | POA: Diagnosis present

## 2020-03-12 DIAGNOSIS — F71 Moderate intellectual disabilities: Secondary | ICD-10-CM | POA: Diagnosis present

## 2020-03-12 DIAGNOSIS — N179 Acute kidney failure, unspecified: Principal | ICD-10-CM | POA: Diagnosis present

## 2020-03-12 DIAGNOSIS — Z79899 Other long term (current) drug therapy: Secondary | ICD-10-CM | POA: Diagnosis not present

## 2020-03-12 DIAGNOSIS — R112 Nausea with vomiting, unspecified: Secondary | ICD-10-CM

## 2020-03-12 DIAGNOSIS — Z791 Long term (current) use of non-steroidal anti-inflammatories (NSAID): Secondary | ICD-10-CM

## 2020-03-12 DIAGNOSIS — R197 Diarrhea, unspecified: Secondary | ICD-10-CM

## 2020-03-12 DIAGNOSIS — F250 Schizoaffective disorder, bipolar type: Secondary | ICD-10-CM | POA: Diagnosis not present

## 2020-03-12 DIAGNOSIS — G2401 Drug induced subacute dyskinesia: Secondary | ICD-10-CM | POA: Diagnosis present

## 2020-03-12 DIAGNOSIS — Z87891 Personal history of nicotine dependence: Secondary | ICD-10-CM

## 2020-03-12 DIAGNOSIS — Z20822 Contact with and (suspected) exposure to covid-19: Secondary | ICD-10-CM | POA: Diagnosis present

## 2020-03-12 DIAGNOSIS — A09 Infectious gastroenteritis and colitis, unspecified: Secondary | ICD-10-CM | POA: Diagnosis not present

## 2020-03-12 DIAGNOSIS — N289 Disorder of kidney and ureter, unspecified: Secondary | ICD-10-CM

## 2020-03-12 LAB — COMPREHENSIVE METABOLIC PANEL
ALT: 29 U/L (ref 0–44)
AST: 70 U/L — ABNORMAL HIGH (ref 15–41)
Albumin: 2.7 g/dL — ABNORMAL LOW (ref 3.5–5.0)
Alkaline Phosphatase: 91 U/L (ref 38–126)
Anion gap: 10 (ref 5–15)
BUN: 60 mg/dL — ABNORMAL HIGH (ref 6–20)
CO2: 26 mmol/L (ref 22–32)
Calcium: 8.5 mg/dL — ABNORMAL LOW (ref 8.9–10.3)
Chloride: 105 mmol/L (ref 98–111)
Creatinine, Ser: 3.28 mg/dL — ABNORMAL HIGH (ref 0.61–1.24)
GFR calc Af Amer: 24 mL/min — ABNORMAL LOW (ref >60)
GFR calc non Af Amer: 21 mL/min — ABNORMAL LOW (ref >60)
Glucose, Bld: 117 mg/dL — ABNORMAL HIGH (ref 70–99)
Potassium: 4.4 mmol/L (ref 3.5–5.1)
Sodium: 141 mmol/L (ref 135–145)
Total Bilirubin: 0.7 mg/dL (ref 0.3–1.2)
Total Protein: 6.1 g/dL — ABNORMAL LOW (ref 6.5–8.1)

## 2020-03-12 LAB — TROPONIN I (HIGH SENSITIVITY)
Troponin I (High Sensitivity): 15 ng/L (ref <18)
Troponin I (High Sensitivity): 14 ng/L (ref <18)

## 2020-03-12 LAB — URINALYSIS, ROUTINE W REFLEX MICROSCOPIC
Bilirubin Urine: NEGATIVE
Glucose, UA: NEGATIVE mg/dL
Ketones, ur: NEGATIVE mg/dL
Leukocytes,Ua: NEGATIVE
Nitrite: NEGATIVE
Protein, ur: NEGATIVE mg/dL
Specific Gravity, Urine: 1.025 (ref 1.005–1.030)
pH: 5.5 (ref 5.0–8.0)

## 2020-03-12 LAB — LIPASE, BLOOD: Lipase: 47 U/L (ref 11–51)

## 2020-03-12 LAB — URINALYSIS, MICROSCOPIC (REFLEX)

## 2020-03-12 LAB — CBC
HCT: 31.5 % — ABNORMAL LOW (ref 39.0–52.0)
Hemoglobin: 10.3 g/dL — ABNORMAL LOW (ref 13.0–17.0)
MCH: 30.2 pg (ref 26.0–34.0)
MCHC: 32.7 g/dL (ref 30.0–36.0)
MCV: 92.4 fL (ref 80.0–100.0)
Platelets: 75 10*3/uL — ABNORMAL LOW (ref 150–400)
RBC: 3.41 MIL/uL — ABNORMAL LOW (ref 4.22–5.81)
RDW: 15.7 % — ABNORMAL HIGH (ref 11.5–15.5)
WBC: 5.1 10*3/uL (ref 4.0–10.5)
nRBC: 0 % (ref 0.0–0.2)

## 2020-03-12 MED ORDER — SODIUM CHLORIDE 0.9 % IV BOLUS
1000.0000 mL | Freq: Once | INTRAVENOUS | Status: AC
  Administered 2020-03-12: 1000 mL via INTRAVENOUS

## 2020-03-12 NOTE — ED Notes (Signed)
Patient transported to X-ray 

## 2020-03-12 NOTE — ED Notes (Signed)
[facility] Nursing Note: Patient Belongings: Black Hat x 1 Black shoes x 1 pair 1 pair of black socks 1 pair of blue sweat pants 1 black band watch 1 white metal bracelet These items on patient when departing [facility] ED

## 2020-03-12 NOTE — H&P (Addendum)
History and Physical    Bradley Murphy MWN:[id number] DOB: [birth year] DOA: 03/12/2020  PCP: Galvin Proffer, MD Patient coming from:  ? home  Chief Complaint: Nausea and vomiting  HPI: Bradley Murphy is a 49 y.o. male with medical history significant of hypertension, CKD stage III with last renal function BUN/creatinine 30/2.02 on 02/23/2020 and followed by nephrologist at [facility], hypothyroidism, moderate mental retardation, schizoaffective disorder, bipolar disorders who was transferred from [facility] ED for evaluation of nausea, vomiting and acute on chronic renal failure.  Patient is a poor historian and has moderate mental retardation, and H&P is obtained by chart review, talking to patient and the staff.  Patient started to have nausea and vomiting after dinner 2 nights ago.  He was reported to have multiple episodes of vomiting yesterday, and could not keep oral intake even water down.  He also has had some diarrheas yesterday.  Today patient reports dizziness and left-sided chest pain, which prompted caregiver to bring patient to the [facility] ED for further evaluation. It is unclear about the details of his chest pain.  In the emergency room, he was afebrile with pulse 115, RR 20, BP 102/77 and room air O2 sats 100%.  Labs showed BUN 60, creatinine 3.28, normal lipase, normal WBC, hemoglobin 10.3, platelet 75, negative UA, negative troponins.  EKG showed sinus tachycardia. CXR showed no acute changes.  CT abdomen and pelvis showed no acute abnormality.  Patient received normal saline 2 L bolus at ED, and transferred to [facility] for further management.   Unable to obtain a complete ROS due to moderate mental retardation and poor historian  Review of Systems:  Positive for nausea, vomiting, diarrhea's, dizziness and left-sided chest pain Unable to obtain a complete ROS due to moderate mental retardation and poor historian  Past Medical History:  Diagnosis Date   Eczema    Moderate  mental retardation    Schizoaffective disorder (HCC)    Thyroid disease     Past Surgical History:  Procedure Laterality Date   HAND SURGERY Right    shoulder surgery Right     SOCIAL HISTORY:  reports that he has never smoked. He has quit using smokeless tobacco. He reports that he does not drink alcohol or use drugs.  Allergies  Allergen Reactions   Chocolate     Per group home records   Chocolate Flavor Other (See Comments)    FAMILY HISTORY: No family history on file.   Prior to Admission medications   Medication Sig Start Date End Date Taking? Authorizing Provider  amantadine (SYMMETREL) 100 MG capsule Take by mouth. 12/05/18   [provider]  ARIPiprazole (ABILIFY) 20 MG tablet Take 40 mg by mouth at bedtime.    [provider]  benztropine (COGENTIN) 1 MG tablet Take 1 mg by mouth 2 (two) times daily.    [provider]  Choline Fenofibrate (TRILIPIX) 135 MG capsule Take 135 mg by mouth daily.    [provider]  divalproex (DEPAKOTE) 500 MG DR tablet Take 1,000 mg by mouth at bedtime.    [provider]  docusate sodium (COLACE) 100 MG capsule Take 100 mg by mouth daily.    [provider]  hydrOXYzine (ATARAX/VISTARIL) 25 MG tablet Take one (1) tablet by mouth three times a day 10/27/18   [provider]  ibuprofen (ADVIL,MOTRIN) 400 MG tablet Take 1 tablet (400 mg total) by mouth every 8 (eight) hours as needed for moderate pain. 01/25/16   Kinner,  Bradley Maduro, MD  ketoconazole (NIZORAL) 2 % shampoo Apply 1 application topically 3 (three) times a week.    [provider]  levothyroxine (SYNTHROID, LEVOTHROID) 50 MCG tablet Take 50 mcg by mouth daily before breakfast.    [provider]  LORazepam (ATIVAN) 1 MG tablet Take 1 mg by mouth every 6 (six) hours.    [provider]  mirtazapine (REMERON) 45 MG tablet Take 45 mg by mouth at bedtime.    [provider]  PARoxetine  (PAXIL) 40 MG tablet Take 40 mg by mouth at bedtime.    [provider]  solifenacin (VESICARE) 5 MG tablet Take 5 mg by mouth daily.    [provider]  terbinafine (LAMISIL) 250 MG tablet Take 250 mg by mouth daily.    [provider]    Physical Exam: Vitals:   03/12/20 1900 03/12/20 2010 03/12/20 2220 03/12/20 2234  BP: 120/75 122/78 (!) 156/103 118/76  Pulse: (!) 104 (!) 108 (!) 58 (!) 105  Resp: 19 20 16    Temp:   97.9 F (36.6 C)   TempSrc:   Oral   SpO2: 100% 100% (!) 66% 99%  Weight:      Height:          Constitutional: NAD, calm, comfortable Eyes: PERRL, lids and conjunctivae normal.  Acute ill-appearing ENMT: Mucous membranes are moist. Posterior pharynx clear of any exudate or lesions.Normal dentition.  Neck: normal, supple, no masses, no thyromegaly Respiratory: clear to auscultation bilaterally, no wheezing, no crackles. Normal respiratory effort. No accessory muscle use.  Cardiovascular: Regular rate and rhythm, no murmurs / rubs / gallops. No extremity edema. 2+ pedal pulses. No carotid bruits.  Abdomen: no tenderness, no masses palpated. No hepatosplenomegaly. Bowel sounds positive.  Musculoskeletal: no clubbing / cyanosis. No joint deformity upper and lower extremities. Good ROM, no contractures. Normal muscle tone.  Skin: no rashes, lesions, ulcers. No induration Neurologic: Alert and oriented times self.  Disoriented to time and place.  Follows simple commands.  Answer yes and no questions.  Move all extremities Psychiatric: Baseline mental retardation    Labs on Admission: I have personally reviewed following labs and imaging studies  CBC: Recent Labs  Lab 03/12/20 1554  WBC 5.1  HGB 10.3*  HCT 31.5*  MCV 92.4  PLT 75*   Basic Metabolic Panel: Recent Labs  Lab 03/12/20 1554  Lonza Shimabukuro 141  Bradley 4.4  CL 105  CO2 26  GLUCOSE 117*  BUN 60*  CREATININE 3.28*  CALCIUM 8.5*   GFR: Estimated Creatinine Clearance: 24.2 mL/min  (A) (by C-G formula based on SCr of 3.28 mg/dL (H)). Liver Function Tests: Recent Labs  Lab 03/12/20 1554  AST 70*  ALT 29  ALKPHOS 91  BILITOT 0.7  PROT 6.1*  ALBUMIN 2.7*   Recent Labs  Lab 03/12/20 1554  LIPASE 47   No results for input(s): AMMONIA in the last 168 hours. Coagulation Profile: No results for input(s): INR, PROTIME in the last 168 hours. Cardiac Enzymes: No results for input(s): CKTOTAL, CKMB, CKMBINDEX, TROPONINI in the last 168 hours. BNP (last 3 results) No results for input(s): PROBNP in the last 8760 hours. HbA1C: No results for input(s): HGBA1C in the last 72 hours. CBG: No results for input(s): GLUCAP in the last 168 hours. Lipid Profile: No results for input(s): CHOL, HDL, LDLCALC, TRIG, CHOLHDL, LDLDIRECT in the last 72 hours. Thyroid Function Tests: No results for input(s): TSH, T4TOTAL, FREET4, T3FREE, THYROIDAB in the last 72  hours. Anemia Panel: No results for input(s): VITAMINB12, FOLATE, FERRITIN, TIBC, IRON, RETICCTPCT in the last 72 hours. Urine analysis:    Component Value Date/Time   COLORURINE YELLOW 03/12/2020 [place] 03/12/2020 1715   APPEARANCEUR Clear 08/14/2013 2330   LABSPEC 1.025 03/12/2020 1715   LABSPEC 1.024 08/14/2013 2330   PHURINE 5.5 03/12/2020 1715   GLUCOSEU NEGATIVE 03/12/2020 1715   GLUCOSEU Negative 08/14/2013 2330   HGBUR SMALL (A) 03/12/2020 1715   BILIRUBINUR NEGATIVE 03/12/2020 1715   BILIRUBINUR Negative 08/14/2013 2330   KETONESUR NEGATIVE 03/12/2020 1715   PROTEINUR NEGATIVE 03/12/2020 1715   NITRITE NEGATIVE 03/12/2020 1715   LEUKOCYTESUR NEGATIVE 03/12/2020 1715   LEUKOCYTESUR Negative 08/14/2013 2330   Sepsis Labs: !!!!!!!!!!!!!!!!!!!!!!!!!!!!!!!!!!!!!!!!!!!! @LABRCNTIP (procalcitonin:4,lacticidven:4) )No results found for this or any previous visit (from the past 240 hour(s)).   Radiological Exams on Admission: CT Abdomen Pelvis Wo Contrast  Result Date: 03/12/2020 CLINICAL  DATA:  Vomiting and diarrhea EXAM: CT ABDOMEN AND PELVIS WITHOUT CONTRAST TECHNIQUE: Multidetector CT imaging of the abdomen and pelvis was performed following the standard protocol without IV contrast. COMPARISON:  CT abdomen pelvis 06/09/2019 FINDINGS: LOWER CHEST: Normal. HEPATOBILIARY: Normal hepatic contours. No intra- or extrahepatic biliary dilatation. Status post cholecystectomy. PANCREAS: Normal pancreas. No ductal dilatation or peripancreatic fluid collection. SPLEEN: Normal. ADRENALS/URINARY TRACT: The adrenal glands are normal. No hydronephrosis, nephroureterolithiasis or solid renal mass. The urinary bladder is normal for degree of distention STOMACH/BOWEL: There is no hiatal hernia. Normal duodenal course and caliber. No small bowel dilatation or inflammation. No focal colonic abnormality. Normal appendix. VASCULAR/LYMPHATIC: Normal course and caliber of the major abdominal vessels. No abdominal or pelvic lymphadenopathy. REPRODUCTIVE: Normal prostate size with symmetric seminal vesicles. MUSCULOSKELETAL. No bony spinal canal stenosis or focal osseous abnormality. OTHER: None. IMPRESSION: No acute abnormality of the abdomen or pelvis. Electronically Signed   By: Ulyses Jarred M.D.   On: 03/12/2020 17:22   DG Chest 2 View  Result Date: 03/12/2020 CLINICAL DATA:  Left chest pain, vomiting. Additional provided: Patient reportedly diagnosed with food poisoning on Monday and Tuesday, dizziness, decreased appetite, low fluid intake for 3 days, nausea/vomiting today, abdominal and left upper chest pain. EXAM: CHEST - 2 VIEW COMPARISON:  Chest radiograph 05/28/2019 FINDINGS: Heart size within normal limits. No evidence of airspace consolidation within the lungs. No evidence of pleural effusion or pneumothorax. No acute bony abnormality is identified. IMPRESSION: No evidence of acute cardiopulmonary abnormality. Electronically Signed   By: Kellie Simmering DO   On: 03/12/2020 17:01     All images have been  reviewed by me personally.  EKG: Independently reviewed.   Assessment/Plan Principal Problem:   Acute on chronic renal failure (HCC) Active Problems:   Moderate intellectual disabilities   Tardive dyskinesia   Worsening renal function   Nausea and vomiting   Diarrhea   CKD (chronic kidney disease), stage III   Anemia due to chronic kidney disease   Thrombocytopenia (HCC)   Schizoaffective disorder (HCC)   Moderate intellectual disability   Atypical chest pain   # acute nausea, vomiting and diarrhea concerning for viral illness  Patient presented with acute onset nausea, vomiting and diarrheas x2 days.  No fever was reported.  No leukocytosis. Lactic acid and COVID is pending. GI biofire PCR is pending  The etiology is likely viral but will wait for above test.   -Supportive care -Antiemesis as needed -IV fluid hydration -Infectious work-up pending including Covid-19 and GI PCR -CT abdomen/pelvis showed no acute  changes or abnormalities.  #Acute on chronic renal failure #CAD stage III, baseline BUN/Cr 30/2.02  Patient is noted to have acute on chronic low failure with BUN/creatinine 60/3.28.  Work-up showed no acute changes on CT abdomen and pelvis.  No evidence of obstructive uropathy.  The etiology likely related to prerenal volume depletion secondary to N/V/D  -Telemetry monitoring - UA, Urine Sonia Bromell, Cr, post void bladder scan ordered -Covid-19 pending - CT abd/pelvis showed no acute antibodies -Received saline bolus 2 L at outside ED -We will start normal saline IVF - repeat BMP - avoid nephrotoxic - consider nephrology consult if no improvement   # chest pain POA # sinus tachycardia   It was reported that patient had chest pain prior to hospitalization.  It is unclear of the details of his chest pain since he is poor historian with moderate mental disability  He denies chest pain currently during my interview.  Troponins negative at OSH.  EKG at OSH showed sinus  tachycardia with no ischemic changes -likely atypical chest pain -Continue to trend troponins -Repeat EKG -Echocardiogram pending  #Chronic anemia Baseline hemoglobin 8.7-9.7 Chronic thrombocytopenia baseline platelet 70s -hemoglobin 10.3 which is at his baseline -Platelet 75 which is at his baseline -Follow-up with PCP and nephrology as outpatient -Monitor   #Hypertension  - chronic and stable  #Hypothyroidism  - TSH pending -Continue home meds   # moderate mental retardation,  # schizoaffective disorder, #Tardive dyskinesia  -Chronic and at his baseline           DVT prophylaxis: SCD in the setting of thrombocytopenia  Code Status: Full code after discussing with patient at bedside. Would need to verify with caregiver in the morning Family Communication: unable to reach family passing midnight Disposition Plan:  home Consults called: none  Admission status: inpatient   Time Spent: 65 minutes.  >50% of the time was devoted to discussing the patients care, assessment, plan and disposition with other care givers along with counseling the patient about the risks and benefits of treatment.    Dede Query MD [facility]  If 7PM-7AM, please contact night-coverage   03/13/2020, 12:35 AM

## 2020-03-12 NOTE — ED Notes (Signed)
Attempted to help PT with urinal but pt could not void at this time

## 2020-03-12 NOTE — ED Notes (Signed)
ED Provider at bedside. 

## 2020-03-12 NOTE — ED Provider Notes (Signed)
[facility] EMERGENCY DEPARTMENT Provider Note   CSN: [id number] Arrival date & time: 03/12/20  1425     History Chief Complaint  Patient presents with  . Abdominal Pain    weakness    [facility] Murphy is a 49 y.o. male.  49 year old male with past medical history including schizoaffective disorder, moderate mental retardation, thyroid disease, CKD who presents with dizziness.  2 nights ago, the patient began having vomiting after dinner.  He continued to have multiple episodes of vomiting yesterday and had difficulty keeping even water down.  Today, he has not had any further vomiting but he has been complaining of feeling dizzy and caregiver reports that he has not been able to drink a lot of fluids, she is concerned about dehydration.  He had diarrhea yesterday.  Today he complained of left-sided chest pain which is what prompted them to come to the ER for evaluation.  Patient states that the pain is nonradiating and intermittent.  He reports feeling short of breath with the vomiting.  LEVEL 5 CAVEAT DUE TO MENTAL RETARDATION  The history is provided by the patient and a caregiver.  Abdominal Pain      Past Medical History:  Diagnosis Date  . Eczema   . Moderate mental retardation   . Schizoaffective disorder (HCC)   . Thyroid disease     Patient Active Problem List   Diagnosis Date Noted  . Worsening renal function 03/12/2020  . Tardive dyskinesia 12/13/2018  . Body mass index (bmi) 30.0-30.9, adult 04/22/2018  . Moderate intellectual disabilities 04/14/2018  . Severe episode of recurrent major depressive disorder, with psychotic features [facility])     Past Surgical History:  Procedure Laterality Date  . HAND SURGERY Right   . shoulder surgery Right        No family history on file.  Social History   Tobacco Use  . Smoking status: Never Smoker  . Smokeless tobacco: Former Engineer, water Use Topics  . Alcohol use: No  . Drug use: Never    Home  Medications Prior to Admission medications   Medication Sig Start Date End Date Taking? Authorizing Provider  amantadine (SYMMETREL) 100 MG capsule Take by mouth. 12/05/18   [provider]  ARIPiprazole (ABILIFY) 20 MG tablet Take 40 mg by mouth at bedtime.    [provider]  benztropine (COGENTIN) 1 MG tablet Take 1 mg by mouth 2 (two) times daily.    [provider]  Choline Fenofibrate (TRILIPIX) 135 MG capsule Take 135 mg by mouth daily.    [provider]  divalproex (DEPAKOTE) 500 MG DR tablet Take 1,000 mg by mouth at bedtime.    [provider]  docusate sodium (COLACE) 100 MG capsule Take 100 mg by mouth daily.    [provider]  hydrOXYzine (ATARAX/VISTARIL) 25 MG tablet Take one (1) tablet by mouth three times a day 10/27/18   [provider]  ibuprofen (ADVIL,MOTRIN) 400 MG tablet Take 1 tablet (400 mg total) by mouth every 8 (eight) hours as needed for moderate pain. 01/25/16   Jene Every, MD  ketoconazole (NIZORAL) 2 % shampoo Apply 1 application topically 3 (three) times a week.    [provider]  levothyroxine (SYNTHROID, LEVOTHROID) 50 MCG tablet Take 50 mcg by mouth daily before breakfast.    [provider]  LORazepam (ATIVAN) 1 MG tablet Take 1 mg by mouth every 6 (six) hours.    [provider]  mirtazapine (REMERON)  45 MG tablet Take 45 mg by mouth at bedtime.    [provider]  PARoxetine (PAXIL) 40 MG tablet Take 40 mg by mouth at bedtime.    [provider]  solifenacin (VESICARE) 5 MG tablet Take 5 mg by mouth daily.    [provider]  terbinafine (LAMISIL) 250 MG tablet Take 250 mg by mouth daily.    [provider]    Allergies    Chocolate and Chocolate flavor  Review of Systems   Review of Systems  Unable to perform ROS: Other  Gastrointestinal: Positive for abdominal pain.  intellectual delay  Physical Exam Updated Vital  Signs BP 95/67 (BP Location: Right Arm)   Pulse (!) 107   Temp 98.4 F (36.9 C) (Oral)   Resp 19   Ht 5\' 8"  (1.727 m)   Wt 63.5 kg   SpO2 100%   BMI 21.29 kg/m   Physical Exam Vitals and nursing note reviewed.  Constitutional:      General: He is not in acute distress.    Appearance: He is well-developed.     Comments: Pale, ill appearing but non-toxic  HENT:     Head: Normocephalic and atraumatic.     Mouth/Throat:     Comments: Dry mucous membranes Eyes:     Conjunctiva/sclera: Conjunctivae normal.  Cardiovascular:     Rate and Rhythm: Regular rhythm. Tachycardia present.     Heart sounds: Normal heart sounds. No murmur.  Pulmonary:     Effort: Pulmonary effort is normal.     Breath sounds: Normal breath sounds.  Abdominal:     General: Bowel sounds are increased. There is no distension.     Palpations: Abdomen is soft.     Tenderness: There is abdominal tenderness. There is no guarding or rebound.     Comments: Mild suprapubic abd tenderness  Musculoskeletal:     Cervical back: Neck supple.  Skin:    General: Skin is warm and dry.  Neurological:     Mental Status: He is alert.     Comments: Alert, following commands  Psychiatric:        Judgment: Judgment normal.     Comments: Delayed speech, tardive dyskinesia      ED Results / Procedures / Treatments   Labs (all labs ordered are listed, but only abnormal results are displayed) Labs Reviewed  COMPREHENSIVE METABOLIC PANEL - Abnormal; Notable for the following components:      Result Value   Glucose, Bld 117 (*)    BUN 60 (*)    Creatinine, Ser 3.28 (*)    Calcium 8.5 (*)    Total Protein 6.1 (*)    Albumin 2.7 (*)    AST 70 (*)    GFR calc non Af Amer 21 (*)    GFR calc Af Amer 24 (*)    All other components within normal limits  CBC - Abnormal; Notable for the following components:   RBC 3.41 (*)    Hemoglobin 10.3 (*)    HCT 31.5 (*)    RDW 15.7 (*)    Platelets 75 (*)    All other  components within normal limits  URINALYSIS, ROUTINE W REFLEX MICROSCOPIC - Abnormal; Notable for the following components:   Hgb urine dipstick SMALL (*)    All other components within normal limits  URINALYSIS, MICROSCOPIC (REFLEX) - Abnormal; Notable for the following components:   Bacteria, UA FEW (*)    All other components within normal limits  SARS CORONAVIRUS 2 (TAT 6-24 HRS)  LIPASE, BLOOD  TROPONIN I (HIGH SENSITIVITY)  TROPONIN I (HIGH SENSITIVITY)    EKG EKG Interpretation  Date/Time:  Tuesday March 12 2020 14:53:43 EDT Ventricular Rate:  114 PR Interval:  168 QRS Duration: 82 QT Interval:  342 QTC Calculation: 471 R Axis:   104 Text Interpretation: Sinus tachycardia Rightward axis Borderline ECG Interpretation limited secondary to artifact tachycardia is similar to previous Confirmed by Frederick Peers [phone]) on 03/12/2020 3:02:17 PM   Radiology CT Abdomen Pelvis Wo Contrast  Result Date: 03/12/2020 CLINICAL DATA:  Vomiting and diarrhea EXAM: CT ABDOMEN AND PELVIS WITHOUT CONTRAST TECHNIQUE: Multidetector CT imaging of the abdomen and pelvis was performed following the standard protocol without IV contrast. COMPARISON:  CT abdomen pelvis 06/09/2019 FINDINGS: LOWER CHEST: Normal. HEPATOBILIARY: Normal hepatic contours. No intra- or extrahepatic biliary dilatation. Status post cholecystectomy. PANCREAS: Normal pancreas. No ductal dilatation or peripancreatic fluid collection. SPLEEN: Normal. ADRENALS/URINARY TRACT: The adrenal glands are normal. No hydronephrosis, nephroureterolithiasis or solid renal mass. The urinary bladder is normal for degree of distention STOMACH/BOWEL: There is no hiatal hernia. Normal duodenal course and caliber. No small bowel dilatation or inflammation. No focal colonic abnormality. Normal appendix. VASCULAR/LYMPHATIC: Normal course and caliber of the major abdominal vessels. No abdominal or pelvic lymphadenopathy. REPRODUCTIVE: Normal prostate size  with symmetric seminal vesicles. MUSCULOSKELETAL. No bony spinal canal stenosis or focal osseous abnormality. OTHER: None. IMPRESSION: No acute abnormality of the abdomen or pelvis. Electronically Signed   By: Deatra Robinson M.D.   On: 03/12/2020 17:22   DG Chest 2 View  Result Date: 03/12/2020 CLINICAL DATA:  Left chest pain, vomiting. Additional provided: Patient reportedly diagnosed with food poisoning on Monday and Tuesday, dizziness, decreased appetite, low fluid intake for 3 days, nausea/vomiting today, abdominal and left upper chest pain. EXAM: CHEST - 2 VIEW COMPARISON:  Chest radiograph 05/28/2019 FINDINGS: Heart size within normal limits. No evidence of airspace consolidation within the lungs. No evidence of pleural effusion or pneumothorax. No acute bony abnormality is identified. IMPRESSION: No evidence of acute cardiopulmonary abnormality. Electronically Signed   By: Jackey Loge DO   On: 03/12/2020 17:01    Procedures Procedures (including critical care time)  Medications Ordered in ED Medications  sodium chloride 0.9 % bolus 1,000 mL (0 mLs Intravenous Stopped 03/12/20 1801)  sodium chloride 0.9 % bolus 1,000 mL (1,000 mLs Intravenous New Bag/Given 03/12/20 1758)    ED Course  I have reviewed the triage vital signs and the nursing notes.  Pertinent labs & imaging results that were available during my care of the patient were reviewed by me and considered in my medical decision making (see chart for details).    MDM Rules/Calculators/A&P                      Patient was mildly ill-appearing, tachycardic, BP 102/77, afebrile.  He had very mild suprapubic abdominal tenderness but abdomen was otherwise soft.  Appears dehydrated.  Lab work shows elevation in baseline creatinine at 3.28 today, BUN 60 consistent with dehydration, normal anion gap, AST 70, normal ALT, normal WBC count, hemoglobin 10.3, baseline is around 9.  Troponin negative.  EKG showed on his tachycardia.  Because of  patient's intellectual delay and limited information, obtained CT abdomen and pelvis to rule out acute intra-abdominal pathology given his complaint of abdominal pain.  CT negative acute and chest x-ray normal.  Because of patient's acute on chronic renal failure with ongoing tachycardia  and soft pressures suggestive of dehydration, recommended admission.  Discussed with [facility] hospitalist, Dr. Jacqulyn Bath, who will admit pt for further treatment. Updated caregiver on plan. Final Clinical Impression(s) / ED Diagnoses Final diagnoses:  Acute renal failure superimposed on chronic kidney disease, unspecified CKD stage, unspecified acute renal failure type [facility])  Dizziness    Rx / DC Orders ED Discharge Orders    None       Orbin Mayeux, Ambrose Finland, MD 03/12/20 [phone]

## 2020-03-12 NOTE — ED Triage Notes (Signed)
Pts caregiver reports pt was diagnosed with food poisoning on Monday and Tuesday. Pt c/o dizziness. Caregiver reports decreased appetite, low fluid intake x 3 days.Pt denies N/V today, Pt c/o abdominal pain and left upper chest pain.

## 2020-03-13 ENCOUNTER — Inpatient Hospital Stay (HOSPITAL_COMMUNITY): Payer: Medicare Other

## 2020-03-13 ENCOUNTER — Encounter (HOSPITAL_COMMUNITY): Payer: Self-pay | Admitting: Internal Medicine

## 2020-03-13 DIAGNOSIS — A09 Infectious gastroenteritis and colitis, unspecified: Secondary | ICD-10-CM

## 2020-03-13 DIAGNOSIS — N189 Chronic kidney disease, unspecified: Secondary | ICD-10-CM

## 2020-03-13 DIAGNOSIS — R079 Chest pain, unspecified: Secondary | ICD-10-CM

## 2020-03-13 DIAGNOSIS — D696 Thrombocytopenia, unspecified: Secondary | ICD-10-CM

## 2020-03-13 DIAGNOSIS — R0789 Other chest pain: Secondary | ICD-10-CM

## 2020-03-13 DIAGNOSIS — F71 Moderate intellectual disabilities: Secondary | ICD-10-CM | POA: Diagnosis present

## 2020-03-13 DIAGNOSIS — D631 Anemia in chronic kidney disease: Secondary | ICD-10-CM

## 2020-03-13 DIAGNOSIS — R112 Nausea with vomiting, unspecified: Secondary | ICD-10-CM

## 2020-03-13 DIAGNOSIS — N183 Chronic kidney disease, stage 3 unspecified: Secondary | ICD-10-CM

## 2020-03-13 DIAGNOSIS — R197 Diarrhea, unspecified: Secondary | ICD-10-CM

## 2020-03-13 DIAGNOSIS — F259 Schizoaffective disorder, unspecified: Secondary | ICD-10-CM | POA: Diagnosis present

## 2020-03-13 DIAGNOSIS — G2401 Drug induced subacute dyskinesia: Secondary | ICD-10-CM

## 2020-03-13 DIAGNOSIS — N179 Acute kidney failure, unspecified: Principal | ICD-10-CM

## 2020-03-13 LAB — BASIC METABOLIC PANEL
Anion gap: 6 (ref 5–15)
Anion gap: 7 (ref 5–15)
BUN: 52 mg/dL — ABNORMAL HIGH (ref 6–20)
BUN: 50 mg/dL — ABNORMAL HIGH (ref 6–20)
CO2: 24 mmol/L (ref 22–32)
CO2: 24 mmol/L (ref 22–32)
Calcium: 8.1 mg/dL — ABNORMAL LOW (ref 8.9–10.3)
Calcium: 7.9 mg/dL — ABNORMAL LOW (ref 8.9–10.3)
Chloride: 114 mmol/L — ABNORMAL HIGH (ref 98–111)
Chloride: 111 mmol/L (ref 98–111)
Creatinine, Ser: 2.9 mg/dL — ABNORMAL HIGH (ref 0.61–1.24)
Creatinine, Ser: 2.61 mg/dL — ABNORMAL HIGH (ref 0.61–1.24)
GFR calc Af Amer: 28 mL/min — ABNORMAL LOW (ref >60)
GFR calc Af Amer: 32 mL/min — ABNORMAL LOW (ref >60)
GFR calc non Af Amer: 24 mL/min — ABNORMAL LOW (ref >60)
GFR calc non Af Amer: 27 mL/min — ABNORMAL LOW (ref >60)
Glucose, Bld: 90 mg/dL (ref 70–99)
Glucose, Bld: 84 mg/dL (ref 70–99)
Potassium: 4.3 mmol/L (ref 3.5–5.1)
Potassium: 4.1 mmol/L (ref 3.5–5.1)
Sodium: 144 mmol/L (ref 135–145)
Sodium: 142 mmol/L (ref 135–145)

## 2020-03-13 LAB — CBC
HCT: 28 % — ABNORMAL LOW (ref 39.0–52.0)
Hemoglobin: 8.8 g/dL — ABNORMAL LOW (ref 13.0–17.0)
MCH: 29.7 pg (ref 26.0–34.0)
MCHC: 31.4 g/dL (ref 30.0–36.0)
MCV: 94.6 fL (ref 80.0–100.0)
Platelets: 55 10*3/uL — ABNORMAL LOW (ref 150–400)
RBC: 2.96 MIL/uL — ABNORMAL LOW (ref 4.22–5.81)
RDW: 15.2 % (ref 11.5–15.5)
WBC: 3.8 10*3/uL — ABNORMAL LOW (ref 4.0–10.5)
nRBC: 0 % (ref 0.0–0.2)

## 2020-03-13 LAB — LIPID PANEL
Cholesterol: 90 mg/dL (ref 0–200)
HDL: <10 mg/dL — ABNORMAL LOW (ref >40)
Triglycerides: 78 mg/dL (ref <150)
VLDL: 16 mg/dL (ref 0–40)

## 2020-03-13 LAB — TROPONIN I (HIGH SENSITIVITY)
Troponin I (High Sensitivity): 14 ng/L (ref <18)
Troponin I (High Sensitivity): 15 ng/L (ref <18)

## 2020-03-13 LAB — TSH: TSH: 1.217 u[IU]/mL (ref 0.350–4.500)

## 2020-03-13 LAB — HEMOGLOBIN A1C
Hgb A1c MFr Bld: 4.1 % — ABNORMAL LOW (ref 4.8–5.6)
Mean Plasma Glucose: 70.97 mg/dL

## 2020-03-13 LAB — SARS CORONAVIRUS 2 (TAT 6-24 HRS): SARS Coronavirus 2: NEGATIVE

## 2020-03-13 LAB — LACTIC ACID, PLASMA: Lactic Acid, Venous: 0.7 mmol/L (ref 0.5–1.9)

## 2020-03-13 MED ORDER — ACETAMINOPHEN 325 MG PO TABS
650.0000 mg | ORAL_TABLET | Freq: Four times a day (QID) | ORAL | Status: DC | PRN
  Administered 2020-03-15: 650 mg via ORAL
  Filled 2020-03-13: qty 2

## 2020-03-13 MED ORDER — PAROXETINE HCL 20 MG PO TABS
40.0000 mg | ORAL_TABLET | Freq: Every day | ORAL | Status: DC
  Administered 2020-03-13: 40 mg via ORAL
  Filled 2020-03-13: qty 2

## 2020-03-13 MED ORDER — HYDROXYZINE HCL 25 MG PO TABS: 25.0000 mg | ORAL_TABLET | Freq: Three times a day (TID) | ORAL | Status: DC | PRN

## 2020-03-13 MED ORDER — ONDANSETRON HCL 4 MG PO TABS: 4.0000 mg | ORAL_TABLET | Freq: Four times a day (QID) | ORAL | Status: DC | PRN

## 2020-03-13 MED ORDER — ONDANSETRON HCL 4 MG/2ML IJ SOLN: 4.0000 mg | Freq: Four times a day (QID) | INTRAMUSCULAR | Status: DC | PRN

## 2020-03-13 MED ORDER — LEVOTHYROXINE SODIUM 50 MCG PO TABS
50.0000 ug | ORAL_TABLET | Freq: Every day | ORAL | Status: DC
  Administered 2020-03-13 – 2020-03-16 (×4): 50 ug via ORAL
  Filled 2020-03-13 (×4): qty 1

## 2020-03-13 MED ORDER — LACTATED RINGERS IV SOLN: INTRAVENOUS | Status: DC

## 2020-03-13 MED ORDER — ACETAMINOPHEN 650 MG RE SUPP: 650.0000 mg | Freq: Four times a day (QID) | RECTAL | Status: DC | PRN

## 2020-03-13 MED ORDER — LORAZEPAM 1 MG PO TABS
1.0000 mg | ORAL_TABLET | Freq: Four times a day (QID) | ORAL | Status: DC
  Administered 2020-03-13 – 2020-03-16 (×14): 1 mg via ORAL
  Filled 2020-03-13 (×15): qty 1

## 2020-03-13 MED ORDER — DOCUSATE SODIUM 100 MG PO CAPS
100.0000 mg | ORAL_CAPSULE | Freq: Every day | ORAL | Status: DC
  Administered 2020-03-13 – 2020-03-14 (×2): 100 mg via ORAL
  Filled 2020-03-13 (×3): qty 1

## 2020-03-13 MED ORDER — SODIUM CHLORIDE 0.45 % IV SOLN: INTRAVENOUS | Status: AC

## 2020-03-13 MED ORDER — SODIUM CHLORIDE 0.9 % IV SOLN: INTRAVENOUS | Status: DC

## 2020-03-13 MED ORDER — ARIPIPRAZOLE 5 MG PO TABS
40.0000 mg | ORAL_TABLET | Freq: Every day | ORAL | Status: DC
  Administered 2020-03-13 – 2020-03-15 (×3): 40 mg via ORAL
  Filled 2020-03-13 (×3): qty 8

## 2020-03-13 MED ORDER — DARIFENACIN HYDROBROMIDE ER 7.5 MG PO TB24
7.5000 mg | ORAL_TABLET | Freq: Every day | ORAL | Status: DC
  Administered 2020-03-13 – 2020-03-16 (×4): 7.5 mg via ORAL
  Filled 2020-03-13 (×4): qty 1

## 2020-03-13 MED ORDER — MIRTAZAPINE 15 MG PO TABS
45.0000 mg | ORAL_TABLET | Freq: Every day | ORAL | Status: DC
  Administered 2020-03-13 – 2020-03-15 (×3): 45 mg via ORAL
  Filled 2020-03-13 (×3): qty 3

## 2020-03-13 MED ORDER — DIVALPROEX SODIUM 500 MG PO DR TAB
1000.0000 mg | DELAYED_RELEASE_TABLET | Freq: Every day | ORAL | Status: DC
  Administered 2020-03-13 – 2020-03-15 (×3): 1000 mg via ORAL
  Filled 2020-03-13 (×3): qty 2

## 2020-03-13 MED ORDER — AMANTADINE HCL 100 MG PO CAPS
100.0000 mg | ORAL_CAPSULE | Freq: Two times a day (BID) | ORAL | Status: DC
  Administered 2020-03-13 – 2020-03-16 (×7): 100 mg via ORAL
  Filled 2020-03-13 (×9): qty 1

## 2020-03-13 NOTE — Plan of Care (Signed)
  Problem: Activity: Goal: Risk for activity intolerance will decrease Outcome: Progressing   Problem: Clinical Measurements: Goal: Diagnostic test results will improve Outcome: Progressing   Problem: Nutrition: Goal: Adequate nutrition will be maintained Outcome: Progressing

## 2020-03-13 NOTE — Progress Notes (Signed)
Post voidal Bladder scan showed 63 cc urine.

## 2020-03-13 NOTE — Progress Notes (Signed)
  Echocardiogram 2D Echocardiogram has been performed.  [facility] 03/13/2020, 12:29 PM

## 2020-03-13 NOTE — Progress Notes (Signed)
PROGRESS NOTE    Bradley Murphy  SAY:[id number] DOB: 30-Apr-1971 DOA: 03/12/2020 PCP: Bonnita Nasuti, MD    Brief Narrative:  49 y.o. male with medical history significant of hypertension, CKD stage III with last renal function BUN/creatinine 30/2.02 on 02/23/2020 and followed by nephrologist at [facility], hypothyroidism, moderate mental retardation, schizoaffective disorder, bipolar disorders who was transferred from [facility] ED for evaluation of nausea, vomiting and acute on chronic renal failure.  Patient is a poor historian and has moderate mental retardation, and H&P is obtained by chart review, talking to patient and the staff.  Patient started to have nausea and vomiting after dinner 2 nights ago.  He was reported to have multiple episodes of vomiting yesterday, and could not keep oral intake even water down.  He also has had some diarrheas yesterday.  Today patient reports dizziness and left-sided chest pain, which prompted caregiver to bring patient to the [facility] ED for further evaluation. It is unclear about the details of his chest pain.  In the emergency room, he was afebrile with pulse 115, RR 20, BP 102/77 and room air O2 sats 100%.  Labs showed BUN 60, creatinine 3.28, normal lipase, normal WBC, hemoglobin 10.3, platelet 75, negative UA, negative troponins.  EKG showed sinus tachycardia. CXR showed no acute changes.  CT abdomen and pelvis showed no acute abnormality.  Patient received normal saline 2 L bolus at ED, and transferred to [facility] for further management.   Assessment & Plan:   Principal Problem:   Acute on chronic renal failure (HCC) Active Problems:   Moderate intellectual disabilities   Tardive dyskinesia   Worsening renal function   Nausea and vomiting   Diarrhea   CKD (chronic kidney disease), stage III   Anemia due to chronic kidney disease   Thrombocytopenia (HCC)   Schizoaffective disorder (HCC)   Moderate intellectual disability   Atypical chest  pain  # acute nausea, vomiting and diarrhea concerning for viral illness  Patient presented with acute onset nausea, vomiting and diarrheas x2 days.  No fever was reported.  No leukocytosis. COVID is neg. GI panel remains pending. Suspicion for viral gastroenteritis  -Advance diet as tolerated -Cont to encourage hydration as tolerated -Continue LR IVF -repeat bmet in AM  #Acute on chronic renal failure #CAD stage III, baseline BUN/Cr 30/2.02  Patient is noted to have acute on chronic low failure with BUN/creatinine 60/3.28.  Work-up showed no acute changes on CT abdomen and pelvis.  No evidence of obstructive uropathy.  The etiology likely related to prerenal volume depletion secondary to N/V/D  -Cr is slowly improving -Cont to encourage PO intake -Cr remains higher than baseline -Have resumed LR IVF -Repeat bmet in AM   # chest pain POA # sinus tachycardia   It was reported that patient had chest pain prior to hospitalization.  It is unclear of the details of his chest pain since he is poor historian with moderate mental disability  Seems stable at this time -likely atypical chest pain -Cont monitor for now  #Chronic anemia Baseline hemoglobin with pancytopenia -hemoglobin currently 8.8, was 10.3 yesterday -No evidence of acute blood loss -Plts and WBC are trending down -Concern for worsening pancytopenia -Will repeat CBC in AM   #Hypertension  - chronic  -BP seems stable at this time  #Hypothyroidism  - TSH 1.217 -Continue home meds   # moderate mental retardation,  # schizoaffective disorder, #Tardive dyskinesia  -Chronic and at his baseline    DVT prophylaxis:  SCD's Code Status: Full Family Communication: Pt in room, family not at bedside  Status is: Inpatient  Remains inpatient appropriate because:IV treatments appropriate due to intensity of illness or inability to take PO and Inpatient level of care appropriate due to severity of  illness   Dispo: The patient is from: Home              Anticipated d/c is to: Unclear dispo at this time. Pending PT/OT eval              Anticipated d/c date is: 2 days              Patient currently is not medically stable to d/c.        Consultants:     Procedures:     Antimicrobials: Anti-infectives (From admission, onward)   None       Subjective: Not really speaking. Nodding yes or no to questions  Objective: Vitals:   03/12/20 2220 03/12/20 2234 03/13/20 0452 03/13/20 0920  BP: (!) 156/103 118/76 120/77 116/71  Pulse: (!) 58 (!) 105 (!) 114 (!) 106  Resp: 16  18 18   Temp: 97.9 F (36.6 C)  98 F (36.7 C) 98 F (36.7 C)  TempSrc: Oral   Oral  SpO2: (!) 66% 99% 97% 98%  Weight:      Height:        Intake/Output Summary (Last 24 hours) at 03/13/2020 1610 Last data filed at 03/13/2020 1300 Gross per 24 hour  Intake 1328.64 ml  Output 310 ml  Net 1018.64 ml   Filed Weights   03/12/20 1432  Weight: 63.5 kg    Examination:  General exam: Appears calm and comfortable  Respiratory system: Clear to auscultation. Respiratory effort normal. Cardiovascular system: S1 & S2 heard, Regular Gastrointestinal system: Abdomen is nondistended, pos BS Central nervous system: Alert No focal neurological deficits. Extremities: Symmetric 5 x 5 power. Skin: No rashes, lesions Psychiatry: Unable to assess given current mentation   Data Reviewed: I have personally reviewed following labs and imaging studies  CBC: Recent Labs  Lab 03/12/20 1554 03/13/20 0616  WBC 5.1 3.8*  HGB 10.3* 8.8*  HCT 31.5* 28.0*  MCV 92.4 94.6  PLT 75* 55*   Basic Metabolic Panel: Recent Labs  Lab 03/12/20 1554 03/13/20 0054 03/13/20 0616  NA 141 144 142  K 4.4 4.3 4.1  CL 105 114* 111  CO2 26 24 24   GLUCOSE 117* 90 84  BUN 60* 52* 50*  CREATININE 3.28* 2.90* 2.61*  CALCIUM 8.5* 8.1* 7.9*   GFR: Estimated Creatinine Clearance: 30.4 mL/min (A) (by C-G formula based  on SCr of 2.61 mg/dL (H)). Liver Function Tests: Recent Labs  Lab 03/12/20 1554  AST 70*  ALT 29  ALKPHOS 91  BILITOT 0.7  PROT 6.1*  ALBUMIN 2.7*   Recent Labs  Lab 03/12/20 1554  LIPASE 47   No results for input(s): AMMONIA in the last 168 hours. Coagulation Profile: No results for input(s): INR, PROTIME in the last 168 hours. Cardiac Enzymes: No results for input(s): CKTOTAL, CKMB, CKMBINDEX, TROPONINI in the last 168 hours. BNP (last 3 results) No results for input(s): PROBNP in the last 8760 hours. HbA1C: Recent Labs    03/13/20 0054  HGBA1C 4.1*   CBG: No results for input(s): GLUCAP in the last 168 hours. Lipid Profile: Recent Labs    03/13/20 0054  CHOL 90  HDL <10*  LDLCALC NOT CALCULATED  TRIG 78  CHOLHDL NOT CALCULATED  Thyroid Function Tests: Recent Labs    03/13/20 0054  TSH 1.217   Anemia Panel: No results for input(s): VITAMINB12, FOLATE, FERRITIN, TIBC, IRON, RETICCTPCT in the last 72 hours. Sepsis Labs: Recent Labs  Lab 03/13/20 0054  LATICACIDVEN 0.7    Recent Results (from the past 240 hour(s))  SARS CORONAVIRUS 2 (TAT 6-24 HRS) Nasopharyngeal Nasopharyngeal Swab     Status: None   Collection Time: 03/12/20  5:55 PM   Specimen: Nasopharyngeal Swab  Result Value Ref Range Status   SARS Coronavirus 2 NEGATIVE NEGATIVE Final    Comment: (NOTE) SARS-CoV-2 target nucleic acids are NOT DETECTED. The SARS-CoV-2 RNA is generally detectable in upper and lower respiratory specimens during the acute phase of infection. Negative results do not preclude SARS-CoV-2 infection, do not rule out co-infections with other pathogens, and should not be used as the sole basis for treatment or other patient management decisions. Negative results must be combined with clinical observations, patient history, and epidemiological information. The expected result is Negative. Fact Sheet for Patients: HairSlick.no Fact  Sheet for Healthcare Providers: quierodirigir.com This test is not yet approved or cleared by the [place] FDA and  has been authorized for detection and/or diagnosis of SARS-CoV-2 by FDA under an Emergency Use Authorization (EUA). This EUA will remain  in effect (meaning this test can be used) for the duration of the COVID-19 declaration under Section 56 4(b)(1) of the Act, 21 U.S.C. section 360bbb-3(b)(1), unless the authorization is terminated or revoked sooner. Performed at [facility], [address], [place], [place] [address]      Radiology Studies: CT Abdomen Pelvis Wo Contrast  Result Date: 03/12/2020 CLINICAL DATA:  Vomiting and diarrhea EXAM: CT ABDOMEN AND PELVIS WITHOUT CONTRAST TECHNIQUE: Multidetector CT imaging of the abdomen and pelvis was performed following the standard protocol without IV contrast. COMPARISON:  CT abdomen pelvis 06/09/2019 FINDINGS: LOWER CHEST: Normal. HEPATOBILIARY: Normal hepatic contours. No intra- or extrahepatic biliary dilatation. Status post cholecystectomy. PANCREAS: Normal pancreas. No ductal dilatation or peripancreatic fluid collection. SPLEEN: Normal. ADRENALS/URINARY TRACT: The adrenal glands are normal. No hydronephrosis, nephroureterolithiasis or solid renal mass. The urinary bladder is normal for degree of distention STOMACH/BOWEL: There is no hiatal hernia. Normal duodenal course and caliber. No small bowel dilatation or inflammation. No focal colonic abnormality. Normal appendix. VASCULAR/LYMPHATIC: Normal course and caliber of the major abdominal vessels. No abdominal or pelvic lymphadenopathy. REPRODUCTIVE: Normal prostate size with symmetric seminal vesicles. MUSCULOSKELETAL. No bony spinal canal stenosis or focal osseous abnormality. OTHER: None. IMPRESSION: No acute abnormality of the abdomen or pelvis. Electronically Signed   By: Deatra Robinson M.D.   On: 03/12/2020 17:22   DG Chest 2 View  Result  Date: 03/12/2020 CLINICAL DATA:  Left chest pain, vomiting. Additional provided: Patient reportedly diagnosed with food poisoning on Monday and Tuesday, dizziness, decreased appetite, low fluid intake for 3 days, nausea/vomiting today, abdominal and left upper chest pain. EXAM: CHEST - 2 VIEW COMPARISON:  Chest radiograph 05/28/2019 FINDINGS: Heart size within normal limits. No evidence of airspace consolidation within the lungs. No evidence of pleural effusion or pneumothorax. No acute bony abnormality is identified. IMPRESSION: No evidence of acute cardiopulmonary abnormality. Electronically Signed   By: Jackey Loge DO   On: 03/12/2020 17:01   ECHOCARDIOGRAM COMPLETE  Result Date: 03/13/2020    ECHOCARDIOGRAM REPORT   Patient Name:   Bradley Murphy Date of Exam: 03/13/2020 Medical Rec #:  [id number]       Height:  68.0 in Accession #:    [id number]      Weight:       140.0 lb Date of Birth:  May 23, 1971       BSA:          1.756 m Patient Age:    48 years        BP:           116/71 mmHg Patient Gender: M               HR:           103 bpm. Exam Location:  Inpatient Procedure: 2D Echo Indications:    chest pain  History:        Patient has no prior history of Echocardiogram examinations. No                 prior cardiac hx on file.  Sonographer:    Celene Skeen RDCS (AE) Referring Phys: 8 NA LI  Sonographer Comments: Technically difficult study due to poor echo windows. IMPRESSIONS  1. Left ventricular ejection fraction, by estimation, is 55 to 60%. The left ventricle has normal function. The left ventricle has no regional wall motion abnormalities. Indeterminate diastolic filling due to E-A fusion.  2. Right ventricular systolic function is normal. The right ventricular size is normal. Tricuspid regurgitation signal is inadequate for assessing PA pressure.  3. The mitral valve is grossly normal. No evidence of mitral valve regurgitation. No evidence of mitral stenosis.  4. The aortic valve is  tricuspid. Aortic valve regurgitation is not visualized. No aortic stenosis is present. FINDINGS  Left Ventricle: Left ventricular ejection fraction, by estimation, is 55 to 60%. The left ventricle has normal function. The left ventricle has no regional wall motion abnormalities. The left ventricular internal cavity size was normal in size. There is  no left ventricular hypertrophy. Indeterminate diastolic filling due to E-A fusion. Right Ventricle: The right ventricular size is normal. No increase in right ventricular wall thickness. Right ventricular systolic function is normal. Tricuspid regurgitation signal is inadequate for assessing PA pressure. Left Atrium: Left atrial size was not well visualized. Right Atrium: Right atrial size was not well visualized. Pericardium: A small pericardial effusion is present. The pericardial effusion is posterior to the left ventricle. Presence of pericardial fat pad. Mitral Valve: The mitral valve is grossly normal. No evidence of mitral valve regurgitation. No evidence of mitral valve stenosis. Tricuspid Valve: The tricuspid valve is grossly normal. Tricuspid valve regurgitation is not demonstrated. No evidence of tricuspid stenosis. Aortic Valve: The aortic valve is tricuspid. Aortic valve regurgitation is not visualized. No aortic stenosis is present. Pulmonic Valve: The pulmonic valve was grossly normal. Pulmonic valve regurgitation is not visualized. No evidence of pulmonic stenosis. Aorta: The aortic root is normal in size and structure. Venous: The inferior vena cava was not well visualized. IAS/Shunts: The atrial septum is grossly normal.  LEFT VENTRICLE PLAX 2D LVIDd:         4.55 cm LVIDs:         2.77 cm LV PW:         1.07 cm LV IVS:        0.76 cm LVOT diam:     2.00 cm LV SV:         34 LV SV Index:   19 LVOT Area:     3.14 cm  LEFT ATRIUM             Index LA diam:  3.70 cm 2.11 cm/m LA Vol (A2C):   31.3 ml 17.82 ml/m LA Vol (A4C):   31.7 ml 18.05 ml/m  LA Biplane Vol: 31.3 ml 17.82 ml/m  AORTIC VALVE LVOT Vmax:   77.70 cm/s LVOT Vmean:  43.200 cm/s LVOT VTI:    0.109 m  AORTA Ao Root diam: 2.60 cm  SHUNTS Systemic VTI:  0.11 m Systemic Diam: 2.00 cm Lennie OdorWesley O'Neal MD Electronically signed by Lennie OdorWesley O'Neal MD Signature Date/Time: 03/13/2020/2:13:24 PM    Final     Scheduled Meds: . amantadine  100 mg Oral BID  . ARIPiprazole  40 mg Oral QHS  . darifenacin  7.5 mg Oral Daily  . divalproex  1,000 mg Oral QHS  . docusate sodium  100 mg Oral Daily  . levothyroxine  50 mcg Oral Q0600  . LORazepam  1 mg Oral Q6H  . mirtazapine  45 mg Oral QHS  . PARoxetine  40 mg Oral QHS   Continuous Infusions:   LOS: 1 day   Rickey BarbaraStephen Scout Guyett, MD [facility] Pager On Amion  If 7PM-7AM, please contact night-coverage 03/13/2020, 4:10 PM

## 2020-03-13 NOTE — Progress Notes (Signed)
New Admission Note:   Arrival Method: stretcher via [facility] from [facility] Mental Orientation: moderate mental retardation Telemetry: 5M07, CCMD notified Assessment: to be Completed Skin: skin intact, redness buttocks IV: LAC, NSL Pain: 0/10 Tubes: None Safety Measures: Safety Fall Prevention Plan has been discussed  Admission: to be completed 5 [place] Orientation: Patient has been orientated to the room, unit and staff.   Family: none at bedside  Orders to be reviewed and implemented. Will continue to monitor the patient. Call light has been placed within reach and bed alarm has been activated.

## 2020-03-13 NOTE — Progress Notes (Signed)
   03/13/20 0452  Assess: MEWS Score  Pulse Rate (!) 114   On call night provider,MD Opyd, made aware. Patient resting comfortably

## 2020-03-14 DIAGNOSIS — D631 Anemia in chronic kidney disease: Secondary | ICD-10-CM

## 2020-03-14 DIAGNOSIS — R42 Dizziness and giddiness: Secondary | ICD-10-CM

## 2020-03-14 LAB — GASTROINTESTINAL PANEL BY PCR, STOOL (REPLACES STOOL CULTURE)

## 2020-03-14 LAB — COMPREHENSIVE METABOLIC PANEL
ALT: 41 U/L (ref 0–44)
AST: 65 U/L — ABNORMAL HIGH (ref 15–41)
Albumin: 2 g/dL — ABNORMAL LOW (ref 3.5–5.0)
Alkaline Phosphatase: 79 U/L (ref 38–126)
Anion gap: 5 (ref 5–15)
BUN: 38 mg/dL — ABNORMAL HIGH (ref 6–20)
CO2: 25 mmol/L (ref 22–32)
Calcium: 8.2 mg/dL — ABNORMAL LOW (ref 8.9–10.3)
Chloride: 113 mmol/L — ABNORMAL HIGH (ref 98–111)
Creatinine, Ser: 2.22 mg/dL — ABNORMAL HIGH (ref 0.61–1.24)
GFR calc Af Amer: 39 mL/min — ABNORMAL LOW (ref >60)
GFR calc non Af Amer: 33 mL/min — ABNORMAL LOW (ref >60)
Glucose, Bld: 86 mg/dL (ref 70–99)
Potassium: 4.1 mmol/L (ref 3.5–5.1)
Sodium: 143 mmol/L (ref 135–145)
Total Bilirubin: 0.8 mg/dL (ref 0.3–1.2)
Total Protein: 4.8 g/dL — ABNORMAL LOW (ref 6.5–8.1)

## 2020-03-14 LAB — CBC
HCT: 25.9 % — ABNORMAL LOW (ref 39.0–52.0)
Hemoglobin: 8.1 g/dL — ABNORMAL LOW (ref 13.0–17.0)
MCH: 28.9 pg (ref 26.0–34.0)
MCHC: 31.3 g/dL (ref 30.0–36.0)
MCV: 92.5 fL (ref 80.0–100.0)
Platelets: 57 10*3/uL — ABNORMAL LOW (ref 150–400)
RBC: 2.8 MIL/uL — ABNORMAL LOW (ref 4.22–5.81)
RDW: 15.1 % (ref 11.5–15.5)
WBC: 3.9 10*3/uL — ABNORMAL LOW (ref 4.0–10.5)
nRBC: 0 % (ref 0.0–0.2)

## 2020-03-14 LAB — CREATININE, URINE, RANDOM: Creatinine, Urine: 82.29 mg/dL

## 2020-03-14 LAB — MAGNESIUM: Magnesium: 1.8 mg/dL (ref 1.7–2.4)

## 2020-03-14 LAB — SODIUM, URINE, RANDOM: Sodium, Ur: 48 mmol/L

## 2020-03-14 MED ORDER — POLYETHYLENE GLYCOL 3350 17 G PO PACK
17.0000 g | PACK | Freq: Every day | ORAL | Status: DC
  Administered 2020-03-14: 17 g via ORAL
  Filled 2020-03-14: qty 1

## 2020-03-14 NOTE — Plan of Care (Signed)
  Problem: Clinical Measurements: Goal: Diagnostic test results will improve Outcome: Progressing   Problem: Activity: Goal: Risk for activity intolerance will decrease Outcome: Progressing   

## 2020-03-14 NOTE — Progress Notes (Signed)
PROGRESS NOTE    Bradley Murphy  ZTI:[id number] DOB: Apr 11, 1971 DOA: 03/12/2020 PCP: Galvin Proffer, MD    Brief Narrative:  49 y.o. male with medical history significant of hypertension, CKD stage III with last renal function BUN/creatinine 30/2.02 on 02/23/2020 and followed by nephrologist at [facility], hypothyroidism, moderate mental retardation, schizoaffective disorder, bipolar disorders who was transferred from [facility] ED for evaluation of nausea, vomiting and acute on chronic renal failure.  Patient is a poor historian and has moderate mental retardation, and H&P is obtained by chart review, talking to patient and the staff.  Patient started to have nausea and vomiting after dinner 2 nights ago.  He was reported to have multiple episodes of vomiting yesterday, and could not keep oral intake even water down.  He also has had some diarrheas yesterday.  Today patient reports dizziness and left-sided chest pain, which prompted caregiver to bring patient to the [facility] ED for further evaluation. It is unclear about the details of his chest pain.  In the emergency room, he was afebrile with pulse 115, RR 20, BP 102/77 and room air O2 sats 100%.  Labs showed BUN 60, creatinine 3.28, normal lipase, normal WBC, hemoglobin 10.3, platelet 75, negative UA, negative troponins.  EKG showed sinus tachycardia. CXR showed no acute changes.  CT abdomen and pelvis showed no acute abnormality.  Patient received normal saline 2 L bolus at ED, and transferred to [facility] for further management.   Assessment & Plan:   Principal Problem:   Acute on chronic renal failure (HCC) Active Problems:   Moderate intellectual disabilities   Tardive dyskinesia   Worsening renal function   Nausea and vomiting   Diarrhea   CKD (chronic kidney disease), stage III   Anemia due to chronic kidney disease   Thrombocytopenia (HCC)   Schizoaffective disorder (HCC)   Moderate intellectual disability   Atypical chest  pain  # acute nausea, vomiting and diarrhea concerning for viral illness  Patient presented with acute onset nausea, vomiting and diarrheas x2 days.  No fever was reported.  No leukocytosis. COVID is neg. GI panel remains pending.   Suspicion for viral gastroenteritis. Seems much improved. More formed stool noted today  -Advance diet as tolerated -Cont to encourage hydration as tolerated -Continue with LR IVF -repeat bmet in AM  #Acute on chronic renal failure #CAD stage III, baseline BUN/Cr 30/2.02  Patient is noted to have acute on chronic low failure with BUN/creatinine 60/3.28.  Work-up showed no acute changes on CT abdomen and pelvis.  No evidence of obstructive uropathy.  The etiology likely related to prerenal volume depletion secondary to N/V/D  -Cr is slowly improving -Cont to encourage PO intake -Cr remains higher than baseline -Have resumed LR IVF -Recheck bmet in AM   # chest pain POA # sinus tachycardia   It was reported that patient had chest pain prior to hospitalization.  It is unclear of the details of his chest pain since he is poor historian with moderate mental disability  Seems stable at this time without chest pain currently -likely atypical chest pain -Cont monitor for now  #Chronic anemia Baseline hemoglobin with pancytopenia -hemoglobin currently 8.1 -No evidence of acute blood loss -Plts and WBC are now slowly trending up -Will repeat CBC in AM   #Hypertension  - chronic  -BP seems stable at this time  #Hypothyroidism  - TSH 1.217 -Continue home meds   # moderate mental retardation,  # schizoaffective disorder, #Tardive dyskinesia  -  Chronic and seems at his baseline    DVT prophylaxis: SCD's Code Status: Full Family Communication: Pt in room, family not at bedside  Status is: Inpatient  Remains inpatient appropriate because:IV treatments appropriate due to intensity of illness or inability to take PO, Inpatient level of care  appropriate due to severity of illness and Also need to monitor pancytopenia   Dispo: The patient is from: Home              Anticipated d/c is to: Unclear dispo at this time. Pending PT/OT eval              Anticipated d/c date is: 2 days              Patient currently is not medically stable to d/c.        Consultants:     Procedures:     Antimicrobials: Anti-infectives (From admission, onward)   None      Subjective: Without complaints this AM  Objective: Vitals:   03/13/20 2205 03/14/20 0432 03/14/20 0948 03/14/20 1657  BP: (!) 147/75 112/60 130/77 (!) 141/72  Pulse: 95 96 (!) 110 100  Resp: 18 17 18 18   Temp: 98.3 F (36.8 C) 98.7 F (37.1 C) 97.9 F (36.6 C) 98.5 F (36.9 C)  TempSrc: Oral Oral Oral Oral  SpO2: 99% 92% 100% 98%  Weight:      Height:        Intake/Output Summary (Last 24 hours) at 03/14/2020 1743 Last data filed at 03/14/2020 1300 Gross per 24 hour  Intake 1887.26 ml  Output 525 ml  Net 1362.26 ml   Filed Weights   03/12/20 1432  Weight: 63.5 kg    Examination: General exam: Awake,sitting in chair, in nad Respiratory system: Normal respiratory effort, no wheezing Cardiovascular system: regular rate, s1, s2 Gastrointestinal system: Soft, nondistended, positive BS Central nervous system: CN2-12 grossly intact, strength intact Extremities: Perfused, no clubbing Skin: Normal skin turgor, no notable skin lesions seen Psychiatry: difficult to assess as pt not verbalizing much  Data Reviewed: I have personally reviewed following labs and imaging studies  CBC: Recent Labs  Lab 03/12/20 1554 03/13/20 0616 03/14/20 0404  WBC 5.1 3.8* 3.9*  HGB 10.3* 8.8* 8.1*  HCT 31.5* 28.0* 25.9*  MCV 92.4 94.6 92.5  PLT 75* 55* 57*   Basic Metabolic Panel: Recent Labs  Lab 03/12/20 1554 03/13/20 0054 03/13/20 0616 03/14/20 0404  NA 141 144 142 143  K 4.4 4.3 4.1 4.1  CL 105 114* 111 113*  CO2 26 24 24 25   GLUCOSE 117* 90 84 86    BUN 60* 52* 50* 38*  CREATININE 3.28* 2.90* 2.61* 2.22*  CALCIUM 8.5* 8.1* 7.9* 8.2*  MG  --   --   --  1.8   GFR: Estimated Creatinine Clearance: 35.8 mL/min (A) (by C-G formula based on SCr of 2.22 mg/dL (H)). Liver Function Tests: Recent Labs  Lab 03/12/20 1554 03/14/20 0404  AST 70* 65*  ALT 29 41  ALKPHOS 91 79  BILITOT 0.7 0.8  PROT 6.1* 4.8*  ALBUMIN 2.7* 2.0*   Recent Labs  Lab 03/12/20 1554  LIPASE 47   No results for input(s): AMMONIA in the last 168 hours. Coagulation Profile: No results for input(s): INR, PROTIME in the last 168 hours. Cardiac Enzymes: No results for input(s): CKTOTAL, CKMB, CKMBINDEX, TROPONINI in the last 168 hours. BNP (last 3 results) No results for input(s): PROBNP in the last 8760 hours. HbA1C: Recent  Labs    03/13/20 0054  HGBA1C 4.1*   CBG: No results for input(s): GLUCAP in the last 168 hours. Lipid Profile: Recent Labs    03/13/20 0054  CHOL 90  HDL <10*  LDLCALC NOT CALCULATED  TRIG 78  CHOLHDL NOT CALCULATED   Thyroid Function Tests: Recent Labs    03/13/20 0054  TSH 1.217   Anemia Panel: No results for input(s): VITAMINB12, FOLATE, FERRITIN, TIBC, IRON, RETICCTPCT in the last 72 hours. Sepsis Labs: Recent Labs  Lab 03/13/20 0054  LATICACIDVEN 0.7    Recent Results (from the past 240 hour(s))  SARS CORONAVIRUS 2 (TAT 6-24 HRS) Nasopharyngeal Nasopharyngeal Swab     Status: None   Collection Time: 03/12/20  5:55 PM   Specimen: Nasopharyngeal Swab  Result Value Ref Range Status   SARS Coronavirus 2 NEGATIVE NEGATIVE Final    Comment: (NOTE) SARS-CoV-2 target nucleic acids are NOT DETECTED. The SARS-CoV-2 RNA is generally detectable in upper and lower respiratory specimens during the acute phase of infection. Negative results do not preclude SARS-CoV-2 infection, do not rule out co-infections with other pathogens, and should not be used as the sole basis for treatment or other patient management  decisions. Negative results must be combined with clinical observations, patient history, and epidemiological information. The expected result is Negative. Fact Sheet for Patients: HairSlick.no Fact Sheet for Healthcare Providers: quierodirigir.com This test is not yet approved or cleared by the [place] FDA and  has been authorized for detection and/or diagnosis of SARS-CoV-2 by FDA under an Emergency Use Authorization (EUA). This EUA will remain  in effect (meaning this test can be used) for the duration of the COVID-19 declaration under Section 56 4(b)(1) of the Act, 21 U.S.C. section 360bbb-3(b)(1), unless the authorization is terminated or revoked sooner. Performed at [facility], [address], [place], [place] [address]      Radiology Studies: ECHOCARDIOGRAM COMPLETE  Result Date: 03/13/2020    ECHOCARDIOGRAM REPORT   Patient Name:   DRAYCEN LEICHTER Date of Exam: 03/13/2020 Medical Rec #:  [id number]       Height:       68.0 in Accession #:    [id number]      Weight:       140.0 lb Date of Birth:  Jun 16, 1971       BSA:          1.756 m Patient Age:    48 years        BP:           116/71 mmHg Patient Gender: M               HR:           103 bpm. Exam Location:  Inpatient Procedure: 2D Echo Indications:    chest pain  History:        Patient has no prior history of Echocardiogram examinations. No                 prior cardiac hx on file.  Sonographer:    Celene Skeen RDCS (AE) Referring Phys: 47 NA LI  Sonographer Comments: Technically difficult study due to poor echo windows. IMPRESSIONS  1. Left ventricular ejection fraction, by estimation, is 55 to 60%. The left ventricle has normal function. The left ventricle has no regional wall motion abnormalities. Indeterminate diastolic filling due to E-A fusion.  2. Right ventricular systolic function is normal. The right ventricular size is normal. Tricuspid regurgitation  signal is inadequate for assessing PA pressure.  3. The mitral valve is grossly normal. No evidence of mitral valve regurgitation. No evidence of mitral stenosis.  4. The aortic valve is tricuspid. Aortic valve regurgitation is not visualized. No aortic stenosis is present. FINDINGS  Left Ventricle: Left ventricular ejection fraction, by estimation, is 55 to 60%. The left ventricle has normal function. The left ventricle has no regional wall motion abnormalities. The left ventricular internal cavity size was normal in size. There is  no left ventricular hypertrophy. Indeterminate diastolic filling due to E-A fusion. Right Ventricle: The right ventricular size is normal. No increase in right ventricular wall thickness. Right ventricular systolic function is normal. Tricuspid regurgitation signal is inadequate for assessing PA pressure. Left Atrium: Left atrial size was not well visualized. Right Atrium: Right atrial size was not well visualized. Pericardium: A small pericardial effusion is present. The pericardial effusion is posterior to the left ventricle. Presence of pericardial fat pad. Mitral Valve: The mitral valve is grossly normal. No evidence of mitral valve regurgitation. No evidence of mitral valve stenosis. Tricuspid Valve: The tricuspid valve is grossly normal. Tricuspid valve regurgitation is not demonstrated. No evidence of tricuspid stenosis. Aortic Valve: The aortic valve is tricuspid. Aortic valve regurgitation is not visualized. No aortic stenosis is present. Pulmonic Valve: The pulmonic valve was grossly normal. Pulmonic valve regurgitation is not visualized. No evidence of pulmonic stenosis. Aorta: The aortic root is normal in size and structure. Venous: The inferior vena cava was not well visualized. IAS/Shunts: The atrial septum is grossly normal.  LEFT VENTRICLE PLAX 2D LVIDd:         4.55 cm LVIDs:         2.77 cm LV PW:         1.07 cm LV IVS:        0.76 cm LVOT diam:     2.00 cm LV SV:          34 LV SV Index:   19 LVOT Area:     3.14 cm  LEFT ATRIUM             Index LA diam:        3.70 cm 2.11 cm/m LA Vol (A2C):   31.3 ml 17.82 ml/m LA Vol (A4C):   31.7 ml 18.05 ml/m LA Biplane Vol: 31.3 ml 17.82 ml/m  AORTIC VALVE LVOT Vmax:   77.70 cm/s LVOT Vmean:  43.200 cm/s LVOT VTI:    0.109 m  AORTA Ao Root diam: 2.60 cm  SHUNTS Systemic VTI:  0.11 m Systemic Diam: 2.00 cm Lennie Odor MD Electronically signed by Lennie Odor MD Signature Date/Time: 03/13/2020/2:13:24 PM    Final     Scheduled Meds: . amantadine  100 mg Oral BID  . ARIPiprazole  40 mg Oral QHS  . darifenacin  7.5 mg Oral Daily  . divalproex  1,000 mg Oral QHS  . docusate sodium  100 mg Oral Daily  . levothyroxine  50 mcg Oral Q0600  . LORazepam  1 mg Oral Q6H  . mirtazapine  45 mg Oral QHS  . polyethylene glycol  17 g Oral Daily   Continuous Infusions: . lactated ringers 75 mL/hr at 03/14/20 1655     LOS: 2 days   Rickey Barbara, MD [facility] Pager On Amion  If 7PM-7AM, please contact night-coverage 03/14/2020, 5:43 PM

## 2020-03-14 NOTE — Progress Notes (Signed)
PT Progress Note for Charges    03/14/20 1400  PT Visit Information  Last PT Received On 03/14/20  PT General Charges  $$ ACUTE PT VISIT 1 Visit  PT Evaluation  $PT Eval Moderate Complexity 1 Mod  PT Treatments  $Gait Training 8-22 mins  Arletta Bale, DPT  Acute Rehabilitation Services Pager [phone] Office [phone]

## 2020-03-14 NOTE — Progress Notes (Signed)
Occupational Therapy Evaluation Patient Details Name: Bradley Murphy MRN: [id number] DOB: Apr 12, 1971 Today's Date: 03/14/2020    History of Present Illness 49 y.o. male with medical history significant of hypertension, CKD stage III with last renal function BUN/creatinine 30/2.02 on 02/23/2020 and followed by nephrologist at [facility], hypothyroidism, moderate mental retardation, schizoaffective disorder, bipolar disorders who was transferred from [facility] ED for evaluation of nausea, vomiting and acute on chronic renal failure.   Clinical Impression   PLOF obtained from pt and caregiver Janice Coffin), as pt with baseline intellectual disability. PTA, pt was Independent with ADLs and mobility without AD. There is 24/7 supervision at group home, per caregiver. Presently, pt with unsteadiness on feet, decreased strength, and endurance. Attempted sit to stand without AD,but pt unable to complete safely, so completed remainder of session with RW. Pt overall Min A for LB ADLs with cues for problem solving needed and maintenance of balance in standing, Min A at most for RW use in room requiring cues for maneuvering around obstacles. Anticipate pt to progress with safety in ADLs/mobility with skilled acute services. Recommend RW and HHOT to follow-up at this time. Will continue to follow and update recommendations as needed.     Follow Up Recommendations  Home health OT;Supervision/Assistance - 24 hour    Equipment Recommendations  Other (comment)(may need RW)    Recommendations for Other Services       Precautions / Restrictions Precautions Precautions: Fall Restrictions Weight Bearing Restrictions: No      Mobility Bed Mobility Overal bed mobility: Needs Assistance Bed Mobility: Supine to Sit     Supine to sit: Supervision;HOB elevated     General bed mobility comments: Supervision with cues for safety/sequencing, HOB elevated, light use of bed rail  Transfers Overall transfer level: Needs  assistance Equipment used: Sliding board;Rolling walker (2 wheeled) Transfers: Sit to/from [employer] Sit to Stand: Min guard Stand pivot transfers: Min assist       General transfer comment: Trialed no AD with pt unable to complete safe sit to stand, min guard for use of RW with cues for hand placement. Min A for RW advancement and manuevering around obstacles    Balance Overall balance assessment: Needs assistance Sitting-balance support: Feet supported Sitting balance-Leahy Scale: Fair     Standing balance support: Bilateral upper extremity supported Standing balance-Leahy Scale: Fair Standing balance comment: MIn A for steadying at times during activity                            ADL either performed or assessed with clinical judgement   ADL Overall ADL's : Needs assistance/impaired Eating/Feeding: Independent;Sitting   Grooming: Min guard;Standing   Upper Body Bathing: Set up;Supervision/ safety;Sitting   Lower Body Bathing: Minimal assistance;Sit to/from stand;Sitting/lateral leans   Upper Body Dressing : Set up;Supervision/safety;Sitting   Lower Body Dressing: Minimal assistance;Sitting/lateral leans;Sit to/from stand Lower Body Dressing Details (indicate cue type and reason): Some difficulty donning socks, minor cues needed for problem solving  Toilet Transfer: Minimal assistance;Ambulation;Regular Toilet;RW   Toileting- Clothing Manipulation and Hygiene: Minimal assistance;Sitting/lateral lean;Sit to/from stand       Functional mobility during ADLs: Minimal assistance;Rolling walker;Cueing for safety;Cueing for sequencing General ADL Comments: Min A at most due to unsteadiness in standing (decreased balance and strength) and minor cues needed for problem solving and sequencing      Vision Baseline Vision/History: No visual deficits       Perception  Praxis      Pertinent Vitals/Pain Pain Assessment: No/denies pain      Hand Dominance Right   Extremity/Trunk Assessment Upper Extremity Assessment Upper Extremity Assessment: Generalized weakness   Lower Extremity Assessment Lower Extremity Assessment: Defer to PT evaluation   Cervical / Trunk Assessment Cervical / Trunk Assessment: Normal   Communication Communication Communication: Expressive difficulties   Cognition Arousal/Alertness: Awake/alert Behavior During Therapy: WFL for tasks assessed/performed Overall Cognitive Status: History of cognitive impairments - at baseline                                 General Comments: Pt with hx of intellectual disability, answers questions appropriately with soft voice    General Comments       Exercises     Shoulder Instructions      Home Living Family/patient expects to be discharged to:: Group home Living Arrangements: Group Home Available Help at Discharge: Personal care attendant;Available 24 hours/day Type of Home: Group Home Home Access: Stairs to enter Entrance Stairs-Number of Steps: 2 Entrance Stairs-Rails: None Home Layout: One level     Bathroom Shower/Tub: Teacher, early years/pre: Standard     Home Equipment: None          Prior Functioning/Environment Level of Independence: Independent        Comments: Pt was Independent with ADLs, mobility at group home without AD        OT Problem List: Decreased strength;Decreased activity tolerance;Impaired balance (sitting and/or standing);Decreased coordination;Decreased knowledge of use of DME or AE      OT Treatment/Interventions: Self-care/ADL training;Therapeutic exercise;Energy conservation;DME and/or AE instruction;Therapeutic activities;Patient/family education    OT Goals(Current goals can be found in the care plan section) Acute Rehab OT Goals Patient Stated Goal: none stated OT Goal Formulation: With patient Time For Goal Achievement: 03/28/20 Potential to Achieve Goals: Good ADL  Goals Pt Will Perform Grooming: with modified independence;standing Pt Will Perform Lower Body Bathing: with set-up;sitting/lateral leans;sit to/from stand Pt Will Perform Lower Body Dressing: with set-up;sit to/from stand;sitting/lateral leans Pt Will Transfer to Toilet: with supervision;ambulating;regular height toilet Pt Will Perform Toileting - Clothing Manipulation and hygiene: with modified independence;sit to/from stand;sitting/lateral leans Pt Will Perform Tub/Shower Transfer: with supervision;Stand pivot transfer;Tub transfer  OT Frequency: Min 3X/week   Barriers to D/C:            Co-evaluation              AM-PAC OT "6 Clicks" Daily Activity     Outcome Measure Help from another person eating meals?: None Help from another person taking care of personal grooming?: A Little Help from another person toileting, which includes using toliet, bedpan, or urinal?: A Little Help from another person bathing (including washing, rinsing, drying)?: A Little Help from another person to put on and taking off regular upper body clothing?: A Little Help from another person to put on and taking off regular lower body clothing?: A Little 6 Click Score: 19   End of Session Equipment Utilized During Treatment: Rolling walker Nurse Communication: Mobility status  Activity Tolerance: Patient tolerated treatment well Patient left: in chair;with call bell/phone within reach;with chair alarm set  OT Visit Diagnosis: Unsteadiness on feet (R26.81);Other abnormalities of gait and mobility (R26.89);Muscle weakness (generalized) (M62.81)                Time: 7564-3329 OT Time Calculation (min): 23 min Charges:  OT  General Charges $OT Visit: 1 Visit OT Evaluation $OT Eval Moderate Complexity: 1 Mod OT Treatments $Self Care/Home Management : 8-22 mins  Lorre Munroe, OTR/L  Lorre Munroe 03/14/2020, 9:39 AM

## 2020-03-14 NOTE — Evaluation (Signed)
Physical Therapy Evaluation Patient Details Name: Bradley Murphy MRN: [id number] DOB: 04/26/1971 Today's Date: 03/14/2020   History of Present Illness  49 y.o. male with medical history significant of hypertension, CKD stage III with last renal function BUN/creatinine 30/2.02 on 02/23/2020 and followed by nephrologist at [facility], hypothyroidism, moderate mental retardation, schizoaffective disorder, bipolar disorders who was transferred from [facility] ED for evaluation of nausea, vomiting and acute on chronic renal failure.    Clinical Impression  Patient presented sitting in chair, awake, and willing to participate in therapy. Pt provided appropriate, yet somewhat incorrect responses regarding PLOF- therefore verified with caregiver which is reflected here. PTA, pt was independent with mobility and all ADL's- per caregiver. Pt lives in a group home with 2-3 steps to enter and 24/7 support. At the time of evaluation, pt was able to stand from the chair with min guard for safety, then again from couch with minguard and RW. He ambulated with min guard and RW for ~100' demonstrating narrow BOS, but able to correct with verbal cues although became more unsteady and reverted to narrow BOS. He also demonstrated slight difficulty navigating RW and problem-solving, occasionally veering R and bumping into the wall, he was able to manage these challenges with inc time and verbal cues. Hopeful to progress activity tolerance and mobility with least restrictive device at future sessions. Recommend HH PT upon d/c once medically appropriate to address deficits and maximize function, safety, and independence.  Pt would continue to benefit from skilled physical therapy services at this time while admitted and after d/c to address the below listed limitations in order to improve overall safety and independence with functional mobility.     Follow Up Recommendations Home health PT;Supervision/Assistance - 24 hour    Equipment  Recommendations  Rolling walker with 5" wheels;3in1 (PT)    Recommendations for Other Services       Precautions / Restrictions Precautions Precautions: Fall Precaution Comments: Pt with mild essential tremor at baseline      Mobility  Bed Mobility Overal bed mobility: Needs Assistance Bed Mobility: Sit to Supine       Sit to supine: Supervision   General bed mobility comments: OOB in chair upon arrival. Supervision to get back into bed for safety.  Transfers Overall transfer level: Needs assistance Equipment used: Rolling walker (2 wheeled);None Transfers: Sit to/from Stand Sit to Stand: Min guard         General transfer comment: Pt able to initially stand from chair with min guard and no AD, then again from couch with RW  Ambulation/Gait Ambulation/Gait assistance: Min guard Gait Distance (Feet): 100 Feet Assistive device: Rolling walker (2 wheeled) Gait Pattern/deviations: Step-through pattern;Decreased stride length;Narrow base of support Gait velocity: mildly dec   General Gait Details: min guard for safety. Pt demo mild difficulty navigating RW and problem solving but was able to safely manage with inc time and verbal cueing. Pt able to widen gait pattern with cues but became more unstable and reverted to narrow BOS  Stairs            Wheelchair Mobility    Modified Rankin (Stroke Patients Only)       Balance Overall balance assessment: Needs assistance Sitting-balance support: No upper extremity supported;Feet supported Sitting balance-Leahy Scale: Fair     Standing balance support: Bilateral upper extremity supported;During functional activity Standing balance-Leahy Scale: Poor Standing balance comment: Pt prefers at least single UE support during activity  Pertinent Vitals/Pain Pain Assessment: Faces Faces Pain Scale: No hurt    Home Living Family/patient expects to be discharged to:: Group  home Living Arrangements: Group Home Available Help at Discharge: Personal care attendant;Available 24 hours/day Type of Home: Group Home Home Access: Stairs to enter Entrance Stairs-Rails: None Entrance Stairs-Number of Steps: 3 Home Layout: One level Home Equipment: None      Prior Function Level of Independence: Independent         Comments: Pt was Independent with ADLs, mobility at group home without AD. Does not work. Enjoys sitting on porch and walking outside     Hand Dominance   Dominant Hand: Right    Extremity/Trunk Assessment   Upper Extremity Assessment Upper Extremity Assessment: Generalized weakness;Defer to OT evaluation    Lower Extremity Assessment Lower Extremity Assessment: Generalized weakness    Cervical / Trunk Assessment Cervical / Trunk Assessment: Normal  Communication   Communication: Expressive difficulties  Cognition Arousal/Alertness: Awake/alert Behavior During Therapy: WFL for tasks assessed/performed Overall Cognitive Status: History of cognitive impairments - at baseline                                 General Comments: Pt with hx of intellectual disability, answers questions appropriately with soft voice. Verified responses with caregiver and found pt to provide moderately incorrect resposnes about PTA independence       General Comments General comments (skin integrity, edema, etc.): HR 112 at rest, up to 124 post-ambulation. Pt with mild essential tremor at baseline.     Exercises     Assessment/Plan    PT Assessment Patient needs continued PT services  PT Problem List Decreased strength;Decreased activity tolerance;Decreased balance;Decreased mobility;Decreased coordination;Decreased cognition;Decreased knowledge of use of DME;Decreased safety awareness;Decreased knowledge of precautions       PT Treatment Interventions DME instruction;Gait training;Stair training;Functional mobility training;Therapeutic  activities;Therapeutic exercise;Balance training;Neuromuscular re-education;Cognitive remediation    PT Goals (Current goals can be found in the Care Plan section)  Acute Rehab PT Goals Patient Stated Goal: none stated PT Goal Formulation: With patient Time For Goal Achievement: 03/28/20 Potential to Achieve Goals: Good    Frequency Min 3X/week   Barriers to discharge        Co-evaluation               AM-PAC PT "6 Clicks" Mobility  Outcome Measure Help needed turning from your back to your side while in a flat bed without using bedrails?: A Little Help needed moving from lying on your back to sitting on the side of a flat bed without using bedrails?: A Little Help needed moving to and from a bed to a chair (including a wheelchair)?: A Little Help needed standing up from a chair using your arms (e.g., wheelchair or bedside chair)?: A Little Help needed to walk in hospital room?: A Little Help needed climbing 3-5 steps with a railing? : A Lot 6 Click Score: 17    End of Session Equipment Utilized During Treatment: Gait belt Activity Tolerance: Patient tolerated treatment well Patient left: in bed;with call bell/phone within reach;with bed alarm set Nurse Communication: Mobility status PT Visit Diagnosis: Unsteadiness on feet (R26.81);Other abnormalities of gait and mobility (R26.89);Muscle weakness (generalized) (M62.81)    Time: 4580-9983 PT Time Calculation (min) (ACUTE ONLY): 24 min   Charges:   PT Evaluation $PT Eval Moderate Complexity: (P) 1 Mod PT Treatments $Gait Training: (P) 8-22 mins  Dara Hoyer, SPT Acute Rehab  [phone]   Dara Hoyer 03/14/2020, 2:17 PM

## 2020-03-14 NOTE — Plan of Care (Signed)
  Problem: Activity: Goal: Risk for activity intolerance will decrease Outcome: Progressing   

## 2020-03-15 LAB — BASIC METABOLIC PANEL
Anion gap: 5 (ref 5–15)
BUN: 28 mg/dL — ABNORMAL HIGH (ref 6–20)
CO2: 27 mmol/L (ref 22–32)
Calcium: 8.4 mg/dL — ABNORMAL LOW (ref 8.9–10.3)
Chloride: 109 mmol/L (ref 98–111)
Creatinine, Ser: 1.77 mg/dL — ABNORMAL HIGH (ref 0.61–1.24)
GFR calc Af Amer: 51 mL/min — ABNORMAL LOW (ref >60)
GFR calc non Af Amer: 44 mL/min — ABNORMAL LOW (ref >60)
Glucose, Bld: 83 mg/dL (ref 70–99)
Potassium: 4.1 mmol/L (ref 3.5–5.1)
Sodium: 141 mmol/L (ref 135–145)

## 2020-03-15 LAB — CBC
HCT: 24.4 % — ABNORMAL LOW (ref 39.0–52.0)
Hemoglobin: 7.6 g/dL — ABNORMAL LOW (ref 13.0–17.0)
MCH: 28.7 pg (ref 26.0–34.0)
MCHC: 31.1 g/dL (ref 30.0–36.0)
MCV: 92.1 fL (ref 80.0–100.0)
Platelets: 57 10*3/uL — ABNORMAL LOW (ref 150–400)
RBC: 2.65 MIL/uL — ABNORMAL LOW (ref 4.22–5.81)
RDW: 15.1 % (ref 11.5–15.5)
WBC: 3.3 10*3/uL — ABNORMAL LOW (ref 4.0–10.5)
nRBC: 0 % (ref 0.0–0.2)

## 2020-03-15 LAB — IRON AND TIBC
Iron: 118 ug/dL (ref 45–182)
Saturation Ratios: 36 % (ref 17.9–39.5)
TIBC: 325 ug/dL (ref 250–450)
UIBC: 207 ug/dL

## 2020-03-15 LAB — VITAMIN B12: Vitamin B-12: 627 pg/mL (ref 180–914)

## 2020-03-15 NOTE — Progress Notes (Signed)
PROGRESS NOTE    Bradley Murphy  ZOX:[id number] DOB: 1971/08/13 DOA: 03/12/2020 PCP: Galvin Proffer, MD    Brief Narrative:  49 y.o. male with medical history significant of hypertension, CKD stage III with last renal function BUN/creatinine 30/2.02 on 02/23/2020 and followed by nephrologist at [facility], hypothyroidism, moderate mental retardation, schizoaffective disorder, bipolar disorders who was transferred from [facility] ED for evaluation of nausea, vomiting and acute on chronic renal failure.  Patient is a poor historian and has moderate mental retardation, and H&P is obtained by chart review, talking to patient and the staff.  Patient started to have nausea and vomiting after dinner 2 nights ago.  He was reported to have multiple episodes of vomiting yesterday, and could not keep oral intake even water down.  He also has had some diarrheas yesterday.  Today patient reports dizziness and left-sided chest pain, which prompted caregiver to bring patient to the [facility] ED for further evaluation. It is unclear about the details of his chest pain.  In the emergency room, he was afebrile with pulse 115, RR 20, BP 102/77 and room air O2 sats 100%.  Labs showed BUN 60, creatinine 3.28, normal lipase, normal WBC, hemoglobin 10.3, platelet 75, negative UA, negative troponins.  EKG showed sinus tachycardia. CXR showed no acute changes.  CT abdomen and pelvis showed no acute abnormality.  Patient received normal saline 2 L bolus at ED, and transferred to [facility] for further management.   Assessment & Plan:   Principal Problem:   Acute on chronic renal failure (HCC) Active Problems:   Moderate intellectual disabilities   Tardive dyskinesia   Worsening renal function   Nausea and vomiting   Diarrhea   CKD (chronic kidney disease), stage III   Anemia due to chronic kidney disease   Thrombocytopenia (HCC)   Schizoaffective disorder (HCC)   Moderate intellectual disability   Atypical chest  pain  # acute nausea, vomiting and diarrhea concerning for viral illness  Patient presented with acute onset nausea, vomiting and diarrheas x2 days.  No fever was reported.  No leukocytosis. COVID is neg. GI panel remains pending.   Suspicion for viral gastroenteritis. Seems much improved. More formed stool noted today  -Advance diet as tolerated -Cont to encourage hydration as tolerated -Continue with LR IVF -continue to follow BMET trends  #Acute on chronic renal failure #CAD stage III, baseline BUN/Cr 30/2.02  Patient is noted to have acute on chronic low failure with BUN/creatinine 60/3.28.  Work-up showed no acute changes on CT abdomen and pelvis.  No evidence of obstructive uropathy.  The etiology likely related to prerenal volume depletion secondary to N/V/D  -Cr is improving, trending towards normal -Continued on gentle LR IVF -repeat bmet in AM   # chest pain POA # sinus tachycardia   It was reported that patient had chest pain prior to hospitalization.  It is unclear of the details of his chest pain since he is poor historian with moderate mental disability  Seems stable at this time without chest pain currently -likely atypical chest pain -denies chest pain this AM  #Chronic anemia Baseline hemoglobin with pancytopenia -hemoglobin currently 7.6 -No evidence of acute blood loss -WBC slightly lower and plts stable at 57 -Unclear etiology for pancytopenia, possible viral etiology given presenting gastroenteritis -Repeat CBC in AM   #Hypertension  - chronic  -BP seems stable currently  #Hypothyroidism  - TSH 1.217 -Continue home meds as tolerated   # moderate mental retardation,  # schizoaffective  disorder, #Tardive dyskinesia  -Chronic and seems at his baseline currently    DVT prophylaxis: SCD's Code Status: Full Family Communication: Pt in room, family not at bedside  Status is: Inpatient  Remains inpatient appropriate because:Inpatient  level of care appropriate due to severity of illness and Also need to monitor pancytopenia, await improvement in blood counts   Dispo: The patient is from: Group home              Anticipated d/c is to: Group home with HHPT/OT              Anticipated d/c date is: 2 days              Patient currently is not medically stable to d/c.        Consultants:     Procedures:     Antimicrobials: Anti-infectives (From admission, onward)   None      Subjective: No complaints this AM. States he has hard time chewing regular food  Objective: Vitals:   03/14/20 1657 03/14/20 2045 03/15/20 0349 03/15/20 0924  BP: (!) 141/72 119/80 121/74 139/71  Pulse: 100 99 (!) 101 (!) 104  Resp: 18 20  18   Temp: 98.5 F (36.9 C) 98.3 F (36.8 C) 98.3 F (36.8 C) 98.1 F (36.7 C)  TempSrc: Oral  Oral Oral  SpO2: 98% 96% 100% (!) 86%  Weight:  62.5 kg    Height:        Intake/Output Summary (Last 24 hours) at 03/15/2020 1628 Last data filed at 03/15/2020 1500 Gross per 24 hour  Intake 2808.56 ml  Output 400 ml  Net 2408.56 ml   Filed Weights   03/12/20 1432 03/14/20 2045  Weight: 63.5 kg 62.5 kg    Examination: General exam: Conversant, in no acute distress Respiratory system: normal chest rise, clear, no audible wheezing Cardiovascular system: regular rhythm, s1-s2 Gastrointestinal system: Nondistended, nontender, pos BS Central nervous system: No seizures, no tremors Extremities: No cyanosis, no joint deformities Skin: No rashes, no pallor Psychiatry: Affect normal // no auditory hallucinations   Data Reviewed: I have personally reviewed following labs and imaging studies  CBC: Recent Labs  Lab 03/12/20 1554 03/13/20 0616 03/14/20 0404 03/15/20 0412  WBC 5.1 3.8* 3.9* 3.3*  HGB 10.3* 8.8* 8.1* 7.6*  HCT 31.5* 28.0* 25.9* 24.4*  MCV 92.4 94.6 92.5 92.1  PLT 75* 55* 57* 57*   Basic Metabolic Panel: Recent Labs  Lab 03/12/20 1554 03/13/20 0054 03/13/20 0616  03/14/20 0404 03/15/20 0412  NA 141 144 142 143 141  K 4.4 4.3 4.1 4.1 4.1  CL 105 114* 111 113* 109  CO2 26 24 24 25 27   GLUCOSE 117* 90 84 86 83  BUN 60* 52* 50* 38* 28*  CREATININE 3.28* 2.90* 2.61* 2.22* 1.77*  CALCIUM 8.5* 8.1* 7.9* 8.2* 8.4*  MG  --   --   --  1.8  --    GFR: Estimated Creatinine Clearance: 44.1 mL/min (A) (by C-G formula based on SCr of 1.77 mg/dL (H)). Liver Function Tests: Recent Labs  Lab 03/12/20 1554 03/14/20 0404  AST 70* 65*  ALT 29 41  ALKPHOS 91 79  BILITOT 0.7 0.8  PROT 6.1* 4.8*  ALBUMIN 2.7* 2.0*   Recent Labs  Lab 03/12/20 1554  LIPASE 47   No results for input(s): AMMONIA in the last 168 hours. Coagulation Profile: No results for input(s): INR, PROTIME in the last 168 hours. Cardiac Enzymes: No results for input(s): CKTOTAL,  CKMB, CKMBINDEX, TROPONINI in the last 168 hours. BNP (last 3 results) No results for input(s): PROBNP in the last 8760 hours. HbA1C: Recent Labs    03/13/20 0054  HGBA1C 4.1*   CBG: No results for input(s): GLUCAP in the last 168 hours. Lipid Profile: Recent Labs    03/13/20 0054  CHOL 90  HDL <10*  LDLCALC NOT CALCULATED  TRIG 78  CHOLHDL NOT CALCULATED   Thyroid Function Tests: Recent Labs    03/13/20 0054  TSH 1.217   Anemia Panel: Recent Labs    03/15/20 0815  VITAMINB12 627  TIBC 325  IRON 118   Sepsis Labs: Recent Labs  Lab 03/13/20 0054  LATICACIDVEN 0.7    Recent Results (from the past 240 hour(s))  SARS CORONAVIRUS 2 (TAT 6-24 HRS) Nasopharyngeal Nasopharyngeal Swab     Status: None   Collection Time: 03/12/20  5:55 PM   Specimen: Nasopharyngeal Swab  Result Value Ref Range Status   SARS Coronavirus 2 NEGATIVE NEGATIVE Final    Comment: (NOTE) SARS-CoV-2 target nucleic acids are NOT DETECTED. The SARS-CoV-2 RNA is generally detectable in upper and lower respiratory specimens during the acute phase of infection. Negative results do not preclude SARS-CoV-2  infection, do not rule out co-infections with other pathogens, and should not be used as the sole basis for treatment or other patient management decisions. Negative results must be combined with clinical observations, patient history, and epidemiological information. The expected result is Negative. Fact Sheet for Patients: SugarRoll.be Fact Sheet for Healthcare Providers: https://www.woods-mathews.com/ This test is not yet approved or cleared by the [place] FDA and  has been authorized for detection and/or diagnosis of SARS-CoV-2 by FDA under an Emergency Use Authorization (EUA). This EUA will remain  in effect (meaning this test can be used) for the duration of the COVID-19 declaration under Section 56 4(b)(1) of the Act, 21 U.S.C. section 360bbb-3(b)(1), unless the authorization is terminated or revoked sooner. Performed at [facility], [place] [address], [place],  [address]   Gastrointestinal Panel by PCR , Stool     Status: Abnormal   Collection Time: 03/14/20 10:14 AM   Specimen: Rectum; Stool  Result Value Ref Range Status   Campylobacter species NOT DETECTED NOT DETECTED Final   Plesimonas shigelloides NOT DETECTED NOT DETECTED Final   Salmonella species NOT DETECTED NOT DETECTED Final   Yersinia enterocolitica NOT DETECTED NOT DETECTED Final   Vibrio species NOT DETECTED NOT DETECTED Final   Vibrio cholerae NOT DETECTED NOT DETECTED Final   Enteroaggregative E coli (EAEC) NOT DETECTED NOT DETECTED Final   Enteropathogenic E coli (EPEC) NOT DETECTED NOT DETECTED Final   Enterotoxigenic E coli (ETEC) NOT DETECTED NOT DETECTED Final   Shiga like toxin producing E coli (STEC) NOT DETECTED NOT DETECTED Final   Shigella/Enteroinvasive E coli (EIEC) NOT DETECTED NOT DETECTED Final   Cryptosporidium NOT DETECTED NOT DETECTED Final   Cyclospora cayetanensis NOT DETECTED NOT DETECTED Final   Entamoeba histolytica NOT  DETECTED NOT DETECTED Final   Giardia lamblia NOT DETECTED NOT DETECTED Final   Adenovirus F40/41 NOT DETECTED NOT DETECTED Final   Astrovirus NOT DETECTED NOT DETECTED Final   Norovirus GI/GII DETECTED (A) NOT DETECTED Final    Comment: RESULT CALLED TO, READ BACK BY AND VERIFIED WITH: JENELLE LAMBERANG ON 03/14/20 AT [address]    Rotavirus A NOT DETECTED NOT DETECTED Final   Sapovirus (I, II, IV, and V) NOT DETECTED NOT DETECTED Final  Comment: Performed at [facility], [address], [place], [place] [address]     Radiology Studies: No results found.  Scheduled Meds: . amantadine  100 mg Oral BID  . ARIPiprazole  40 mg Oral QHS  . darifenacin  7.5 mg Oral Daily  . divalproex  1,000 mg Oral QHS  . docusate sodium  100 mg Oral Daily  . levothyroxine  50 mcg Oral Q0600  . LORazepam  1 mg Oral Q6H  . mirtazapine  45 mg Oral QHS  . polyethylene glycol  17 g Oral Daily   Continuous Infusions: . lactated ringers 75 mL/hr at 03/15/20 1038     LOS: 3 days   Rickey Barbara, MD [facility] Pager On Amion  If 7PM-7AM, please contact night-coverage 03/15/2020, 4:28 PM

## 2020-03-15 NOTE — Patient Care Conference (Signed)
Tried to call patient's father at number listed for update. No answer. Will try again later.

## 2020-03-15 NOTE — Progress Notes (Signed)
Occupational Therapy Treatment Patient Details Name: Bradley Murphy MRN: [id number] DOB: 17-Dec-1970 Today's Date: 03/15/2020    History of present illness 49 y.o. male with medical history significant of hypertension, CKD stage III with last renal function BUN/creatinine 30/2.02 on 02/23/2020 and followed by nephrologist at [facility], hypothyroidism, moderate mental retardation, schizoaffective disorder, bipolar disorders who was transferred from [facility] ED for evaluation of nausea, vomiting and acute on chronic renal failure.   OT comments  Pt progressing with OT goals and completed full ADL session this AM. Pt overall Setup/Supervision for UB/LB bathing at sink, grooming tasks, and UB/LB dressing tasks. Pt able to don socks today with increased ease. Pt min guard at most for mobility in room to/from bathroom without AD. Pt still demo some unsteadiness, but improved since previous session. Pt min guard for urination in standing at toilet. Due to tremors, encouraged pt to sit for urination task to maximize safety and hygiene. Will continue to follow acutely.    Follow Up Recommendations  Home health OT;Supervision/Assistance - 24 hour    Equipment Recommendations  (RW)    Recommendations for Other Services      Precautions / Restrictions Precautions Precautions: Fall Precaution Comments: Pt with mild essential tremor at baseline Restrictions Weight Bearing Restrictions: No       Mobility Bed Mobility Overal bed mobility: Needs Assistance Bed Mobility: Supine to Sit     Supine to sit: Supervision;HOB elevated        Transfers Overall transfer level: Needs assistance Equipment used: Rolling walker (2 wheeled);None Transfers: Sit to/from [employer] to Stand: Min guard Stand pivot transfers: Min guard       General transfer comment: min guard progressing to supervision for sit to stands without RW, min guard to ensure safety     Balance                                            ADL either performed or assessed with clinical judgement   ADL Overall ADL's : Needs assistance/impaired     Grooming: Set up;Sitting;Wash/dry face;Applying deodorant   Upper Body Bathing: Set up;Supervision/ safety;Sitting   Lower Body Bathing: Set up;Supervison/ safety;Sit to/from stand;Sitting/lateral leans Lower Body Bathing Details (indicate cue type and reason): Supervision to ensure safety, cues for sequencing of task  Upper Body Dressing : Set up;Supervision/safety;Sitting   Lower Body Dressing: Set up;Supervision/safety;Sitting/lateral leans;Sit to/from stand Lower Body Dressing Details (indicate cue type and reason): Setup/supervision to don socks x 2 during ADLs  Toilet Transfer: Min guard;Ambulation;Regular Teacher, adult education Details (indicate cue type and reason): min guard without AD for mobility to/from bathroom, unsteadiness but no overt LOB Toileting- Clothing Manipulation and Hygiene: Min guard;Sit to/from stand Toileting - Clothing Manipulation Details (indicate cue type and reason): min guard for urination standing at toilet, pt with tremors and urinated on floor a little. Educated to sit to urinate if tremors worse on a given day      Functional mobility during ADLs: Min guard;Rolling walker(min guard with and without RW) General ADL Comments: Improving ADLs to min guard at most to ensure stability in standing with and without RW     Vision       Perception     Praxis      Cognition Arousal/Alertness: Awake/alert Behavior During Therapy: WFL for tasks assessed/performed Overall Cognitive Status: History of cognitive impairments - at  baseline                                 General Comments: Pt with hx of intellectual disability, answers questions appropriately with soft voice. Verified responses with caregiver and found pt to provide moderately incorrect resposnes about PTA independence          Exercises     Shoulder Instructions       General Comments      Pertinent Vitals/ Pain       Pain Assessment: No/denies pain Faces Pain Scale: No hurt  Home Living                                          Prior Functioning/Environment              Frequency  Min 3X/week        Progress Toward Goals  OT Goals(current goals can now be found in the care plan section)  Progress towards OT goals: Progressing toward goals  Acute Rehab OT Goals Patient Stated Goal: none stated OT Goal Formulation: With patient Time For Goal Achievement: 03/28/20 Potential to Achieve Goals: Good ADL Goals Pt Will Perform Grooming: with modified independence;standing Pt Will Perform Lower Body Bathing: with set-up;sitting/lateral leans;sit to/from stand Pt Will Perform Lower Body Dressing: with set-up;sit to/from stand;sitting/lateral leans Pt Will Transfer to Toilet: with supervision;ambulating;regular height toilet Pt Will Perform Toileting - Clothing Manipulation and hygiene: with modified independence;sit to/from stand;sitting/lateral leans Pt Will Perform Tub/Shower Transfer: with supervision;Stand pivot transfer;Tub transfer  Plan Discharge plan remains appropriate    Co-evaluation                 AM-PAC OT "6 Clicks" Daily Activity     Outcome Measure   Help from another person eating meals?: None Help from another person taking care of personal grooming?: A Little Help from another person toileting, which includes using toliet, bedpan, or urinal?: A Little Help from another person bathing (including washing, rinsing, drying)?: A Little Help from another person to put on and taking off regular upper body clothing?: A Little Help from another person to put on and taking off regular lower body clothing?: A Little 6 Click Score: 19    End of Session Equipment Utilized During Treatment: Rolling walker  OT Visit Diagnosis: Unsteadiness on feet  (R26.81);Other abnormalities of gait and mobility (R26.89);Muscle weakness (generalized) (M62.81)   Activity Tolerance Patient tolerated treatment well   Patient Left in chair;with call bell/phone within reach;with chair alarm set   Nurse Communication Mobility status        Time: 4098-1191 OT Time Calculation (min): 44 min  Charges: OT General Charges $OT Visit: 1 Visit OT Treatments $Self Care/Home Management : 23-37 mins $Therapeutic Activity: 8-22 mins  Layla Maw, OTR/L   Layla Maw 03/15/2020, 1:27 PM

## 2020-03-15 NOTE — TOC Initial Note (Signed)
Transition of Care [facility]) - Initial/Assessment Note    Patient Details  Name: Bradley Murphy MRN: [id number] Date of Birth: 09/14/1971  Transition of Care [facility]) CM/SW Contact:    Bess Kinds, RN Phone Number: [phone] 03/15/2020, 11:19 AM  Clinical Narrative:                  Received call back from one of patient's caregivers, Geanie Berlin. PTA patient lives in group home where he was previously independent with basic needs.  Contact information for patient and caregivers verified in Epic.   Discussed home health recommendations for PT and OT, and agency choice. Referral accepted by [facility] for PT and OT. Patient will need HH orders for PT and OT with Face to Face.   Referral sent to [facility] for RW and 3N1. Patient will need DME orders for RW and 3N1.   Janice Coffin will pick up patient at time of discharge.   Preferred pharmacy at time of discharge is [facility] on [address] - pharmacy updated in [place].    Nisha provided contact information for patient's father, Jeshurun [place] [phone]. Spoke with Dimas Aguas on the phone. Patient does not have a legal guardian. Dimas Aguas has regular visits with patient. Howard requested update from MD - MD notified of request.   TOC following for transition needs.   Expected Discharge Plan: Home w Home Health Services Barriers to Discharge: Continued Medical Work up   Patient Goals and CMS Choice Patient states their goals for this hospitalization and ongoing recovery are:: return home to group home CMS Medicare.gov Compare Post Acute Care list provided to:: Patient Represenative (must comment) Choice offered to / list presented to : [facility] POA / Guardian  Expected Discharge Plan and Services Expected Discharge Plan: Home w Home Health Services In-house Referral: NA Discharge Planning Services: CM Consult Post Acute Care Choice: Home Health Living arrangements for the past 2 months: Group Home                 DME Arranged: 3-N-1, Walker  rolling DME Agency: [facility]       HH Arranged: OT, PT HH Agency: [facility] Date HH Agency Contacted: 03/15/20 Time HH Agency Contacted: 1119 Representative spoke with at [facility] Agency: Lupita Leash  Prior Living Arrangements/Services Living arrangements for the past 2 months: Group Home Lives with:: Facility Resident          Need for Family Participation in Patient Care: Yes (Comment) Care giver support system in place?: Yes (comment)   Criminal Activity/Legal Involvement Pertinent to Current Situation/Hospitalization: No - Comment as needed  Activities of Daily Living      Permission Sought/Granted                  Emotional Assessment       Orientation: : Oriented to Self Alcohol / Substance Use: Not Applicable Psych Involvement: No (comment)  Admission diagnosis:  Dizziness [R42] Worsening renal function [N28.9] Acute renal failure superimposed on chronic kidney disease, unspecified CKD stage, unspecified acute renal failure type (HCC) [N17.9, N18.9] Acute on chronic renal failure (HCC) [N17.9, N18.9] Patient Active Problem List   Diagnosis Date Noted  . Nausea and vomiting 03/13/2020  . Diarrhea 03/13/2020  . CKD (chronic kidney disease), stage III 03/13/2020  . Anemia due to chronic kidney disease 03/13/2020  . Thrombocytopenia (HCC) 03/13/2020  . Atypical chest pain 03/13/2020  . Schizoaffective disorder (HCC)   . Moderate intellectual disability   . Worsening renal function 03/12/2020  .  Acute on chronic renal failure ([place]) 03/12/2020  . Tardive dyskinesia 12/13/2018  . Body mass index (bmi) 30.0-30.9, adult 04/22/2018  . Moderate intellectual disabilities 04/14/2018  . Severe episode of recurrent major depressive disorder, with psychotic features ([place])    PCP:  Bonnita Nasuti, MD Pharmacy:   [place], [address] [place] [address] Phone: [phone] Fax:  [place], [address] [place] [address] Phone: [phone] Fax: [phone]  [facility] [place], [address] [place] [address] Phone: [phone] Fax: [phone]     Social Determinants of Health (SDOH) Interventions    Readmission Risk Interventions No flowsheet data found.

## 2020-03-16 DIAGNOSIS — F71 Moderate intellectual disabilities: Secondary | ICD-10-CM

## 2020-03-16 DIAGNOSIS — F250 Schizoaffective disorder, bipolar type: Secondary | ICD-10-CM

## 2020-03-16 LAB — CBC WITH DIFFERENTIAL/PLATELET
Abs Immature Granulocytes: 0.04 10*3/uL (ref 0.00–0.07)
Basophils Absolute: 0 10*3/uL (ref 0.0–0.1)
Basophils Relative: 1 %
Eosinophils Absolute: 0.1 10*3/uL (ref 0.0–0.5)
Eosinophils Relative: 2 %
HCT: 23.8 % — ABNORMAL LOW (ref 39.0–52.0)
Hemoglobin: 7.5 g/dL — ABNORMAL LOW (ref 13.0–17.0)
Immature Granulocytes: 1 %
Lymphocytes Relative: 37 %
Lymphs Abs: 1.2 10*3/uL (ref 0.7–4.0)
MCH: 29.6 pg (ref 26.0–34.0)
MCHC: 31.5 g/dL (ref 30.0–36.0)
MCV: 94.1 fL (ref 80.0–100.0)
Monocytes Absolute: 0.4 10*3/uL (ref 0.1–1.0)
Monocytes Relative: 12 %
Neutro Abs: 1.6 10*3/uL — ABNORMAL LOW (ref 1.7–7.7)
Neutrophils Relative %: 47 %
Platelets: 49 10*3/uL — ABNORMAL LOW (ref 150–400)
RBC: 2.53 MIL/uL — ABNORMAL LOW (ref 4.22–5.81)
RDW: 15.2 % (ref 11.5–15.5)
WBC: 3.3 10*3/uL — ABNORMAL LOW (ref 4.0–10.5)
nRBC: 0 % (ref 0.0–0.2)

## 2020-03-16 NOTE — Discharge Summary (Signed)
Physician Discharge Summary  Bradley SandiferHarold Murphy FAO:[id number] DOB: 05/24/1971 DOA: 03/12/2020  PCP: Galvin ProfferHague, Imran P, MD  Admit date: 03/12/2020 Discharge date: 03/16/2020  Admitted From: Group home  Disposition:  Group home  Recommendations for Outpatient Follow-up:  1. Follow up with PCP in 1-2 weeks 2. Follow up with Hematology as scheduled 3. Recommend repeat bmet and CBC within one week, PCP to follow  Home Health:PT, OT  Equipment/Devices:Rolling Walker, 3 in 1 commode    Discharge Condition:Improved CODE STATUS:Full Diet recommendation: Regular   Brief/Interim Summary: 48 y.o.malewith medical history significant ofhypertension, CKD stage III with last renal function BUN/creatinine 30/2.02 on 02/23/2020 and [facility], hypothyroidism, moderate mental retardation, schizoaffective disorder, bipolar disorders who [facility], vomiting and acute on chronic renal failure.  Patient is a poor historian and has moderate mental retardation,and H&P is obtained by chart review, talking to patient and the staff.  Patient started to have nausea and vomiting after dinner 2 nights ago. He was reported to have multiple episodes of vomiting yesterday, and could not keep oral intake even water down. He also has had some diarrheas yesterday. Today patient reports dizziness and left-sided chest pain,which prompted caregiver [facility].It is unclear about the details of his chest pain.In the emergency room, he was afebrile with pulse 115, RR 20, BP 102/77 and room air O2 sats 100%. Labs showed BUN 60, creatinine 3.28, normal lipase, normal WBC, hemoglobin 10.3, platelet 75, negative UA, negative troponins.EKG showed sinus tachycardia. CXRshowed no acute changes. CT abdomen and pelvis showed no acute abnormality. Patient received normal saline 2 L bolus at ED,and [facility] management.   Discharge Diagnoses:  Principal Problem:   Acute on chronic renal failure (HCC) Active Problems:   Moderate intellectual disabilities   Tardive dyskinesia   Worsening renal function   Nausea and vomiting   Diarrhea   CKD (chronic kidney disease), stage III   Anemia due to chronic kidney disease   Thrombocytopenia (HCC)   Schizoaffective disorder (HCC)   Moderate intellectual disability   Atypical chest pain  # acutenausea, vomiting and diarrhea concerning for viral illness  Patient presented with acute onset nausea, vomiting and diarrheas x2 days. No fever was reported.No leukocytosis. COVID is neg. GI panel remains pending.   Suspicion for viral gastroenteritis. Seems much improved. More formed stool noted today  -Successfully advanced diet -Resolved  #Acute on chronic renal failure #CAD stage III, baseline BUN/Cr30/2.02  Patient is noted to have acute on chronic low failure with BUN/creatinine 60/3.28.Work-up showed no acute changes on CT abdomen and pelvis.No evidence of obstructive uropathy.  The etiology likely related to prerenal volume depletion secondary toN/V/D  -Cr is improving, trending towards normal with hydration -Recommend repeat bmet within one week   # chest pain POA # sinus tachycardia   It wasreported that patient had chest pain prior to hospitalization.It is unclear of the details of his chest pain since heis poor historian with moderate mental disability  Seems stable at this time without chest pain currently -likely atypical chest pain -denies chest pain this AM  #Chronic anemia Baseline hemoglobin with pancytopenia -possible related to viral syndrome -outside labs reviewed. 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 Discussed with pt's family who is unaware of formal hematologic workup.  -Have asked [facility] to arrange outpatient follow up with  patient following d/c   #  Hypertension  - chronic  -BP seems stable currently  #Hypothyroidism  - TSH 1.217 -Continue home meds as tolerated   #moderate mental retardation, #schizoaffective disorder, #Tardive dyskinesia  -Chronic and seems at his baseline currently     Discharge Instructions   Allergies as of 03/16/2020      Reactions   Chocolate    Per group home records   Chocolate Flavor Other (See Comments)      Medication List    STOP taking these medications   ibuprofen 400 MG tablet Commonly known as: ADVIL     TAKE these medications   amantadine 100 MG capsule Commonly known as: SYMMETREL Take 100 mg by mouth 2 (two) times daily.   ARIPiprazole 20 MG tablet Commonly known as: ABILIFY Take 20 mg by mouth at bedtime. Take 1 tablet with 10mg  table to equal 30mg  at bedtime   ARIPiprazole 10 MG tablet Commonly known as: ABILIFY Take 10 mg by mouth at bedtime. Take 1 tablet with 20mg  tablet to equal 30mg  at bedtime   carbidopa-levodopa 25-100 MG tablet Commonly known as: SINEMET IR Take 1 tablet by mouth 2 (two) times daily.   divalproex 500 MG 24 hr tablet Commonly known as: DEPAKOTE ER Take 1,000 mg by mouth at bedtime.   docusate sodium 100 MG capsule Commonly known as: COLACE Take 100 mg by mouth at bedtime.   hydrOXYzine 25 MG tablet Commonly known as: ATARAX/VISTARIL Take 25 mg by mouth 3 (three) times daily.   ketoconazole 2 % shampoo Commonly known as: NIZORAL Apply 1 application topically every Tuesday, Thursday, and Saturday at 6 PM.   levothyroxine 50 MCG tablet Commonly known as: SYNTHROID Take 50 mcg by mouth daily before breakfast.   LORazepam 1 MG tablet Commonly known as: ATIVAN Take 1 mg by mouth 3 (three) times daily.   mirtazapine 15 MG tablet Commonly known as: REMERON Take 15 mg by mouth at bedtime.   pantoprazole 40 MG tablet Commonly known as: PROTONIX Take 40 mg by mouth every morning.   PARoxetine 40 MG  tablet Commonly known as: PAXIL Take 40 mg by mouth at bedtime.   rosuvastatin 20 MG tablet Commonly known as: CRESTOR Take 20 mg by mouth at bedtime.   solifenacin 5 MG tablet Commonly known as: VESICARE Take 5 mg by mouth daily.   Trilipix 135 MG capsule Generic drug: Choline Fenofibrate Take 135 mg by mouth daily.   Vascepa 1 g capsule Generic drug: icosapent Ethyl Take 1 g by mouth 2 (two) times daily.   Vitamin D 50 MCG (2000 UT) tablet Take 2,000 Units by mouth daily.            Durable Medical Equipment  (From admission, onward)         Start     Ordered   03/15/20 1630  For home use only DME Walker rolling  Once    Question Answer Comment  Walker: With 5 Inch Wheels   Patient needs a walker to treat with the following condition Weakness      03/15/20 1629   03/15/20 1630  For home use only DME 3 n 1  Once     03/15/20 1629         Follow-up Information    Hague, 03/17/20, MD. Schedule an appointment as soon as possible for a visit in 1 week(s).   Specialty: Internal Medicine Contact information: [address] [place] 03/17/20 03/17/20 [phone]        [facility] Follow up.   Why: Follow up as scheduled         Allergies  Allergen Reactions  . Chocolate     Per group home records  . Chocolate Flavor Other (See Comments)    Consultations:  Discussed case with Hematology  Procedures/Studies: CT Abdomen Pelvis Wo Contrast  Result Date: 03/12/2020 CLINICAL DATA:  Vomiting and diarrhea EXAM: CT ABDOMEN AND PELVIS WITHOUT CONTRAST TECHNIQUE: Multidetector CT imaging of the abdomen and pelvis was performed following the standard protocol without IV contrast. COMPARISON:  CT abdomen pelvis 06/09/2019 FINDINGS: LOWER CHEST: Normal. HEPATOBILIARY: Normal hepatic contours. No intra- or extrahepatic biliary dilatation. Status post cholecystectomy. PANCREAS: Normal pancreas. No ductal dilatation or peripancreatic fluid collection.  SPLEEN: Normal. ADRENALS/URINARY TRACT: The adrenal glands are normal. No hydronephrosis, nephroureterolithiasis or solid renal mass. The urinary bladder is normal for degree of distention STOMACH/BOWEL: There is no hiatal hernia. Normal duodenal course and caliber. No small bowel dilatation or inflammation. No focal colonic abnormality. Normal appendix. VASCULAR/LYMPHATIC: Normal course and caliber of the major abdominal vessels. No abdominal or pelvic lymphadenopathy. REPRODUCTIVE: Normal prostate size with symmetric seminal vesicles. MUSCULOSKELETAL. No bony spinal canal stenosis or focal osseous abnormality. OTHER: None. IMPRESSION: No acute abnormality of the abdomen or pelvis. Electronically Signed   By: Ulyses Jarred M.D.   On: 03/12/2020 17:22   DG Chest 2 View  Result Date: 03/12/2020 CLINICAL DATA:  Left chest pain, vomiting. Additional provided: Patient reportedly diagnosed with food poisoning on Monday and Tuesday, dizziness, decreased appetite, low fluid intake for 3 days, nausea/vomiting today, abdominal and left upper chest pain. EXAM: CHEST - 2 VIEW COMPARISON:  Chest radiograph 05/28/2019 FINDINGS: Heart size within normal limits. No evidence of airspace consolidation within the lungs. No evidence of pleural effusion or pneumothorax. No acute bony abnormality is identified. IMPRESSION: No evidence of acute cardiopulmonary abnormality. Electronically Signed   By: Kellie Simmering DO   On: 03/12/2020 17:01   ECHOCARDIOGRAM COMPLETE  Result Date: 03/13/2020    ECHOCARDIOGRAM REPORT   Patient Name:   Bradley Murphy Date of Exam: 03/13/2020 Medical Rec #:  [id number]       Height:       68.0 in Accession #:    [id number]      Weight:       140.0 lb Date of Birth:  25-Apr-1971       BSA:          1.756 m Patient Age:    70 years        BP:           116/71 mmHg Patient Gender: M               HR:           103 bpm. Exam Location:  Inpatient Procedure: 2D Echo Indications:    chest pain  History:         Patient has no prior history of Echocardiogram examinations. No                 prior cardiac hx on file.  Sonographer:    Jannett Celestine RDCS (AE) Referring Phys: [id number] NA LI  Sonographer Comments: Technically difficult study due to poor echo windows. IMPRESSIONS  1. Left ventricular ejection fraction, by estimation, is 55 to 60%. The left ventricle has normal function. The left ventricle has no regional wall motion abnormalities. Indeterminate diastolic filling due to E-A fusion.  2. Right ventricular systolic  function is normal. The right ventricular size is normal. Tricuspid regurgitation signal is inadequate for assessing PA pressure.  3. The mitral valve is grossly normal. No evidence of mitral valve regurgitation. No evidence of mitral stenosis.  4. The aortic valve is tricuspid. Aortic valve regurgitation is not visualized. No aortic stenosis is present. FINDINGS  Left Ventricle: Left ventricular ejection fraction, by estimation, is 55 to 60%. The left ventricle has normal function. The left ventricle has no regional wall motion abnormalities. The left ventricular internal cavity size was normal in size. There is  no left ventricular hypertrophy. Indeterminate diastolic filling due to E-A fusion. Right Ventricle: The right ventricular size is normal. No increase in right ventricular wall thickness. Right ventricular systolic function is normal. Tricuspid regurgitation signal is inadequate for assessing PA pressure. Left Atrium: Left atrial size was not well visualized. Right Atrium: Right atrial size was not well visualized. Pericardium: A small pericardial effusion is present. The pericardial effusion is posterior to the left ventricle. Presence of pericardial fat pad. Mitral Valve: The mitral valve is grossly normal. No evidence of mitral valve regurgitation. No evidence of mitral valve stenosis. Tricuspid Valve: The tricuspid valve is grossly normal. Tricuspid valve regurgitation is not demonstrated. No  evidence of tricuspid stenosis. Aortic Valve: The aortic valve is tricuspid. Aortic valve regurgitation is not visualized. No aortic stenosis is present. Pulmonic Valve: The pulmonic valve was grossly normal. Pulmonic valve regurgitation is not visualized. No evidence of pulmonic stenosis. Aorta: The aortic root is normal in size and structure. Venous: The inferior vena cava was not well visualized. IAS/Shunts: The atrial septum is grossly normal.  LEFT VENTRICLE PLAX 2D LVIDd:         4.55 cm LVIDs:         2.77 cm LV PW:         1.07 cm LV IVS:        0.76 cm LVOT diam:     2.00 cm LV SV:         34 LV SV Index:   19 LVOT Area:     3.14 cm  LEFT ATRIUM             Index LA diam:        3.70 cm 2.11 cm/m LA Vol (A2C):   31.3 ml 17.82 ml/m LA Vol (A4C):   31.7 ml 18.05 ml/m LA Biplane Vol: 31.3 ml 17.82 ml/m  AORTIC VALVE LVOT Vmax:   77.70 cm/s LVOT Vmean:  43.200 cm/s LVOT VTI:    0.109 m  AORTA Ao Root diam: 2.60 cm  SHUNTS Systemic VTI:  0.11 m Systemic Diam: 2.00 cm Lennie Odor MD Electronically signed by Lennie Odor MD Signature Date/Time: 03/13/2020/2:13:24 PM    Final      Subjective: Eager to return to group home  Discharge Exam: Vitals:   03/16/20 0458 03/16/20 0916  BP: 128/73 121/78  Pulse: 98 (!) 102  Resp: 14 14  Temp: 98.2 F (36.8 C) 98 F (36.7 C)  SpO2: 98% 98%   Vitals:   03/15/20 0924 03/15/20 2102 03/16/20 0458 03/16/20 0916  BP: 139/71 127/82 128/73 121/78  Pulse: (!) 104 (!) 56 98 (!) 102  Resp: 18 18 14 14   Temp: 98.1 F (36.7 C) 98.4 F (36.9 C) 98.2 F (36.8 C) 98 F (36.7 C)  TempSrc: Oral Oral Oral Oral  SpO2: (!) 86% 96% 98% 98%  Weight:  64.2 kg    Height:  General: Pt is alert, awake, not in acute distress Cardiovascular: RRR, S1/S2 +, no rubs, no gallops Respiratory: CTA bilaterally, no wheezing, no rhonchi Abdominal: Soft, NT, ND, bowel sounds + Extremities: no edema, no cyanosis   The results of significant diagnostics from this  hospitalization (including imaging, microbiology, ancillary and laboratory) are listed below for reference.     Microbiology: Recent Results (from the past 240 hour(s))  SARS CORONAVIRUS 2 (TAT 6-24 HRS) Nasopharyngeal Nasopharyngeal Swab     Status: None   Collection Time: 03/12/20  5:55 PM   Specimen: Nasopharyngeal Swab  Result Value Ref Range Status   SARS Coronavirus 2 NEGATIVE NEGATIVE Final    Comment: (NOTE) SARS-CoV-2 target nucleic acids are NOT DETECTED. The SARS-CoV-2 RNA is generally detectable in upper and lower respiratory specimens during the acute phase of infection. Negative results do not preclude SARS-CoV-2 infection, do not rule out co-infections with other pathogens, and should not be used as the sole basis for treatment or other patient management decisions. Negative results must be combined with clinical observations, patient history, and epidemiological information. The expected result is Negative. Fact Sheet for Patients: HairSlick.no Fact Sheet for Healthcare Providers: quierodirigir.com This test is not yet approved or cleared by the [place] FDA and  has been authorized for detection and/or diagnosis of SARS-CoV-2 by FDA under an Emergency Use Authorization (EUA). This EUA will remain  in effect (meaning this test can be used) for the duration of the COVID-19 declaration under Section 56 4(b)(1) of the Act, 21 U.S.C. section 360bbb-3(b)(1), unless the authorization is terminated or revoked sooner. Performed at [facility], [address], [place], [place] [address]   Gastrointestinal Panel by PCR , Stool     Status: Abnormal   Collection Time: 03/14/20 10:14 AM   Specimen: Rectum; Stool  Result Value Ref Range Status   Campylobacter species NOT DETECTED NOT DETECTED Final   Plesimonas shigelloides NOT DETECTED NOT DETECTED Final   Salmonella species NOT DETECTED NOT DETECTED Final    Yersinia enterocolitica NOT DETECTED NOT DETECTED Final   Vibrio species NOT DETECTED NOT DETECTED Final   Vibrio cholerae NOT DETECTED NOT DETECTED Final   Enteroaggregative E coli (EAEC) NOT DETECTED NOT DETECTED Final   Enteropathogenic E coli (EPEC) NOT DETECTED NOT DETECTED Final   Enterotoxigenic E coli (ETEC) NOT DETECTED NOT DETECTED Final   Shiga like toxin producing E coli (STEC) NOT DETECTED NOT DETECTED Final   Shigella/Enteroinvasive E coli (EIEC) NOT DETECTED NOT DETECTED Final   Cryptosporidium NOT DETECTED NOT DETECTED Final   Cyclospora cayetanensis NOT DETECTED NOT DETECTED Final   Entamoeba histolytica NOT DETECTED NOT DETECTED Final   Giardia lamblia NOT DETECTED NOT DETECTED Final   Adenovirus F40/41 NOT DETECTED NOT DETECTED Final   Astrovirus NOT DETECTED NOT DETECTED Final   Norovirus GI/GII DETECTED (A) NOT DETECTED Final    Comment: RESULT CALLED TO, READ BACK BY AND VERIFIED WITH: JENELLE LAMBERANG ON 03/14/20 AT 2345 SRC    Rotavirus A NOT DETECTED NOT DETECTED Final   Sapovirus (I, II, IV, and V) NOT DETECTED NOT DETECTED Final    Comment: Performed at [facility], [address] [place], [place] [address]     Labs: BNP (last 3 results) No results for input(s): BNP in the last 8760 hours. Basic Metabolic Panel: Recent Labs  Lab 03/12/20 1554 03/13/20 0054 03/13/20 0616 03/14/20 0404 03/15/20 0412  NA 141 144 142 143 141  K 4.4 4.3 4.1  4.1 4.1  CL 105 114* 111 113* 109  CO2 GLUCOSE 117* 90 84 86 83  BUN 60* 52* 50* 38* 28*  CREATININE 3.28* 2.90* 2.61* 2.22* 1.77*  CALCIUM 8.5* 8.1* 7.9* 8.2* 8.4*  MG  --   --   --  1.8  --    Liver Function Tests: Recent Labs  Lab 03/12/20 1554 03/14/20 0404  AST 70* 65*  ALT 29 41  ALKPHOS 91 79  BILITOT 0.7 0.8  PROT 6.1* 4.8*  ALBUMIN 2.7* 2.0*   Recent Labs  Lab 03/12/20 1554  LIPASE 47   No results for input(s): AMMONIA in the last 168 hours. CBC: Recent Labs   Lab 03/12/20 1554 03/13/20 0616 03/14/20 0404 03/15/20 0412 03/16/20 0405  WBC 5.1 3.8* 3.9* 3.3* 3.3*  NEUTROABS  --   --   --   --  1.6*  HGB 10.3* 8.8* 8.1* 7.6* 7.5*  HCT 31.5* 28.0* 25.9* 24.4* 23.8*  MCV 92.4 94.6 92.5 92.1 94.1  PLT 75* 55* 57* 57* 49*   Cardiac Enzymes: No results for input(s): CKTOTAL, CKMB, CKMBINDEX, TROPONINI in the last 168 hours. BNP: Invalid input(s): POCBNP CBG: No results for input(s): GLUCAP in the last 168 hours. D-Dimer No results for input(s): DDIMER in the last 72 hours. Hgb A1c No results for input(s): HGBA1C in the last 72 hours. Lipid Profile No results for input(s): CHOL, HDL, LDLCALC, TRIG, CHOLHDL, LDLDIRECT in the last 72 hours. Thyroid function studies No results for input(s): TSH, T4TOTAL, T3FREE, THYROIDAB in the last 72 hours.  Invalid input(s): FREET3 Anemia work up Recent Labs    03/15/20 0815  VITAMINB12 627  TIBC 325  IRON 118   Urinalysis    Component Value Date/Time   COLORURINE YELLOW 03/12/2020 1715   APPEARANCEUR CLEAR 03/12/2020 1715   APPEARANCEUR Clear 08/14/2013 2330   LABSPEC 1.025 03/12/2020 1715   LABSPEC 1.024 08/14/2013 2330   PHURINE 5.5 03/12/2020 1715   GLUCOSEU NEGATIVE 03/12/2020 1715   GLUCOSEU Negative 08/14/2013 2330   HGBUR SMALL (A) 03/12/2020 1715   BILIRUBINUR NEGATIVE 03/12/2020 1715   BILIRUBINUR Negative 08/14/2013 2330   KETONESUR NEGATIVE 03/12/2020 1715   PROTEINUR NEGATIVE 03/12/2020 1715   NITRITE NEGATIVE 03/12/2020 1715   LEUKOCYTESUR NEGATIVE 03/12/2020 1715   LEUKOCYTESUR Negative 08/14/2013 2330   Sepsis Labs Invalid input(s): PROCALCITONIN,  WBC,  LACTICIDVEN Microbiology Recent Results (from the past 240 hour(s))  SARS CORONAVIRUS 2 (TAT 6-24 HRS) Nasopharyngeal Nasopharyngeal Swab     Status: None   Collection Time: 03/12/20  5:55 PM   Specimen: Nasopharyngeal Swab  Result Value Ref Range Status   SARS Coronavirus 2 NEGATIVE NEGATIVE Final    Comment:  (NOTE) SARS-CoV-2 target nucleic acids are NOT DETECTED. The SARS-CoV-2 RNA is generally detectable in upper and lower respiratory specimens during the acute phase of infection. Negative results do not preclude SARS-CoV-2 infection, do not rule out co-infections with other pathogens, and should not be used as the sole basis for treatment or other patient management decisions. Negative results must be combined with clinical observations, patient history, and epidemiological information. The expected result is Negative. Fact Sheet for Patients: HairSlick.no Fact Sheet for Healthcare Providers: quierodirigir.com This test is not yet approved or cleared by the [place] FDA and  has been authorized for detection and/or diagnosis of SARS-CoV-2 by FDA under an Emergency Use Authorization (EUA). This EUA will remain  in effect (meaning this test can be used)  for the duration of the COVID-19 declaration under Section 56 4(b)(1) of the Act, 21 U.S.C. section 360bbb-3(b)(1), unless the authorization is terminated or revoked sooner. Performed at [facility], [address] [place], [place] [address]   Gastrointestinal Panel by PCR , Stool     Status: Abnormal   Collection Time: 03/14/20 10:14 AM   Specimen: Rectum; Stool  Result Value Ref Range Status   Campylobacter species NOT DETECTED NOT DETECTED Final   Plesimonas shigelloides NOT DETECTED NOT DETECTED Final   Salmonella species NOT DETECTED NOT DETECTED Final   Yersinia enterocolitica NOT DETECTED NOT DETECTED Final   Vibrio species NOT DETECTED NOT DETECTED Final   Vibrio cholerae NOT DETECTED NOT DETECTED Final   Enteroaggregative E coli (EAEC) NOT DETECTED NOT DETECTED Final   Enteropathogenic E coli (EPEC) NOT DETECTED NOT DETECTED Final   Enterotoxigenic E coli (ETEC) NOT DETECTED NOT DETECTED Final   Shiga like toxin producing E coli (STEC) NOT DETECTED NOT  DETECTED Final   Shigella/Enteroinvasive E coli (EIEC) NOT DETECTED NOT DETECTED Final   Cryptosporidium NOT DETECTED NOT DETECTED Final   Cyclospora cayetanensis NOT DETECTED NOT DETECTED Final   Entamoeba histolytica NOT DETECTED NOT DETECTED Final   Giardia lamblia NOT DETECTED NOT DETECTED Final   Adenovirus F40/41 NOT DETECTED NOT DETECTED Final   Astrovirus NOT DETECTED NOT DETECTED Final   Norovirus GI/GII DETECTED (A) NOT DETECTED Final    Comment: RESULT CALLED TO, READ BACK BY AND VERIFIED WITH: JENELLE LAMBERANG ON 03/14/20 AT 2345 SRC    Rotavirus A NOT DETECTED NOT DETECTED Final   Sapovirus (I, II, IV, and V) NOT DETECTED NOT DETECTED Final    Comment: Performed at [facility], [address], [place], [place] [address]   Time spent: 30 min  SIGNED:   Rickey Barbara, MD  [facility] 03/16/2020, 2:57 PM  If 7PM-7AM, please contact night-coverage

## 2020-03-16 NOTE — TOC Transition Note (Addendum)
Transition of Care [facility]) - CM/SW Discharge Note   Patient Details  Name: Bradley Murphy MRN: [id number] Date of Birth: Nov 07, 1971  [facility] CM/SW Contact:  Luiz Blare Phone Number: 03/16/2020, 3:57 PM   Clinical Narrative:    Patient will DC to: Group Home Anticipated DC date: 03/16/2020 Family notified: Father  Transport by: Financial controller   Per MD patient ready for DC to Group Home. RN, patient, patient's family, and facility notified of DC. DC packet on chart. CSW confirmed Nisha with group home will transport patient. Confirmed Janice Coffin will locate a RW for patient.   CSW will sign off for now as social work intervention is no longer needed. Please consult [place] again if new needs arise.   Final next level of care: Group Home Barriers to Discharge: No Barriers Identified   Patient Goals and CMS Choice Patient states their goals for this hospitalization and ongoing recovery are:: return home to group home CMS Medicare.gov Compare Post Acute Care list provided to:: Patient Represenative (must comment) Choice offered to / list presented to : Patient  Discharge Placement                Patient to be transferred to facility by: Personal Vehicle Name of family member notified: Janice Coffin, group home Patient and family notified of of transfer: 03/16/20  Discharge Plan and Services In-house Referral: Clinical Social Work Discharge Planning Services: [employer] Consult Post Acute Care Choice: Home Health          DME Arranged: 3-N-1 DME Agency: [facility]       HH Arranged: OT, PT HH Agency: [facility] Date HH Agency Contacted: 03/15/20 Time HH Agency Contacted: [id number] Representative spoke with at [facility] Agency: Lupita Leash  Social Determinants of Health (SDOH) Interventions     Readmission Risk Interventions No flowsheet data found.

## 2020-03-16 NOTE — Progress Notes (Signed)
DISCHARGE NOTE HOME Bradley Murphy to be discharged Home per MD order. Discussed prescriptions and follow up appointments with the patient. Prescriptions given to patient; medication list explained in detail. Patient verbalized understanding.  Skin clean, dry and intact without evidence of skin break down, no evidence of skin tears noted. IV catheter discontinued intact. Site without signs and symptoms of complications. Dressing and pressure applied. Pt denies pain at the site currently. No complaints noted.  Patient free of lines, drains, and wounds.   An After Visit Summary (AVS) was printed and given to the patient. Patient escorted via wheelchair, and discharged home via private auto.  Selina Cooley BSN, RN3

## 2020-03-16 NOTE — Social Work (Signed)
Contacted Geanie Berlin to confirm patient is ready for pick-up. CSW left a voicemail, awaiting a callback.   Verlon Au, MSW, [employer]

## 2020-03-16 NOTE — TOC Progression Note (Signed)
Transition of Care [facility]) - Progression Note    Patient Details  Name: Bradley Murphy MRN: [id number] Date of Birth: [birth year]  Transition [facility]) CM/SW Contact  Nonda Lou, [place] Phone Number: 03/16/2020, 2:00 PM  Clinical Narrative:    CSW contacted one of patient's caregivers Geanie Berlin to inquire on patient discharging back to facility today. CSW informed Janice Coffin a RW was not able to be provided to patient due to him receiving one in July. CSW informed her a RW can be purchased outside the hospital.  CSW contacted [employer] and requested a 3n1 be delivered to patient's room. CSW continuing to follow.  Expected Discharge Plan: Home w Home Health Services Barriers to Discharge: Continued Medical Work up  Expected Discharge Plan and Services Expected Discharge Plan: Home w Home Health Services In-house Referral: NA Discharge Planning Services: CM Consult Post Acute Care Choice: Home Health Living arrangements for the past 2 months: Group Home                 DME Arranged: 3-N-1, Walker rolling DME Agency: [employer]       HH Arranged: OT, PT HH Agency: [facility] Date HH Agency Contacted: 03/15/20 Time HH Agency Contacted: [id number] Representative spoke with at [facility] Agency: Lupita Leash   Social Determinants of Health (SDOH) Interventions    Readmission Risk Interventions No flowsheet data found.

## 2020-03-17 LAB — FOLATE RBC
Folate, Hemolysate: 257 ng/mL
Folate, RBC: 1152 ng/mL (ref >498)
Hematocrit: 22.3 % — ABNORMAL LOW (ref 37.5–51.0)

## 2020-03-20 ENCOUNTER — Telehealth: Payer: Self-pay | Admitting: Hematology & Oncology

## 2020-03-20 NOTE — Telephone Encounter (Signed)
Spoke with patient caregiver to confirm new patient appointment 5/14 at 1030 am  Per dr Myna Hidalgo staff msg 4/28

## 2020-04-03 MED ORDER — LEVOTHYROXINE SODIUM 50 MCG PO TABS
50.00 | ORAL_TABLET | ORAL | Status: DC
Start: 2020-04-05 — End: 2020-04-03

## 2020-04-03 MED ORDER — ACETAMINOPHEN 325 MG PO TABS
650.00 | ORAL_TABLET | ORAL | Status: DC
Start: ? — End: 2020-04-03

## 2020-04-03 MED ORDER — ONDANSETRON HCL 4 MG/2ML IJ SOLN
4.00 | INTRAMUSCULAR | Status: DC
Start: ? — End: 2020-04-03

## 2020-04-03 MED ORDER — HEPARIN SODIUM (PORCINE) 5000 UNIT/ML IJ SOLN
5000.00 | INTRAMUSCULAR | Status: DC
Start: 2020-04-03 — End: 2020-04-03

## 2020-04-03 MED ORDER — PAROXETINE HCL 20 MG PO TABS
40.00 | ORAL_TABLET | ORAL | Status: DC
Start: 2020-04-05 — End: 2020-04-03

## 2020-04-03 MED ORDER — GENERIC EXTERNAL MEDICATION
40.00 | Status: DC
Start: 2020-04-04 — End: 2020-04-03

## 2020-04-03 MED ORDER — SODIUM CHLORIDE 0.45 % IV SOLN
INTRAVENOUS | Status: DC
Start: ? — End: 2020-04-03

## 2020-04-03 MED ORDER — HYDRALAZINE HCL 10 MG PO TABS
10.00 | ORAL_TABLET | ORAL | Status: DC
Start: ? — End: 2020-04-03

## 2020-04-03 MED ORDER — MIRTAZAPINE 15 MG PO TABS
45.00 | ORAL_TABLET | ORAL | Status: DC
Start: 2020-04-04 — End: 2020-04-03

## 2020-04-03 MED ORDER — FENOFIBRATE 160 MG PO TABS
160.00 | ORAL_TABLET | ORAL | Status: DC
Start: 2020-04-05 — End: 2020-04-03

## 2020-04-03 MED ORDER — DIVALPROEX SODIUM 250 MG PO DR TAB
1000.00 | DELAYED_RELEASE_TABLET | ORAL | Status: DC
Start: 2020-04-04 — End: 2020-04-03

## 2020-04-03 MED ORDER — AMANTADINE HCL 100 MG PO CAPS
100.00 | ORAL_CAPSULE | ORAL | Status: DC
Start: 2020-04-04 — End: 2020-04-03

## 2020-04-03 MED ORDER — ATORVASTATIN CALCIUM 10 MG PO TABS
20.00 | ORAL_TABLET | ORAL | Status: DC
Start: 2020-04-04 — End: 2020-04-03

## 2020-04-03 MED ORDER — MELATONIN 3 MG PO TABS
3.00 | ORAL_TABLET | ORAL | Status: DC
Start: ? — End: 2020-04-03

## 2020-04-03 MED ORDER — HYDROXYZINE HCL 25 MG PO TABS
25.00 | ORAL_TABLET | ORAL | Status: DC
Start: ? — End: 2020-04-03

## 2020-04-04 MED ORDER — CARBIDOPA-LEVODOPA 25-100 MG PO TABS
1.00 | ORAL_TABLET | ORAL | Status: DC
Start: 2020-04-04 — End: 2020-04-04

## 2020-04-05 ENCOUNTER — Inpatient Hospital Stay: Payer: Medicare Other | Attending: Hematology & Oncology | Admitting: Hematology & Oncology

## 2020-04-05 ENCOUNTER — Inpatient Hospital Stay: Payer: Medicare Other

## 2020-04-23 DEATH — deceased

## 2020-07-13 IMAGING — DX DG CHEST 2V
2 series · 2 of 2 positions shown · non-contrast
Comparison: Chest radiograph 05/28/2019

CLINICAL DATA: Left chest pain, vomiting. Additional provided:
Patient reportedly diagnosed with food poisoning on [REDACTED] and
[REDACTED], dizziness, decreased appetite, low fluid intake for 3 days,
nausea/vomiting today, abdominal and left upper chest pain.

EXAM:
CHEST - 2 VIEW

[chest lat]
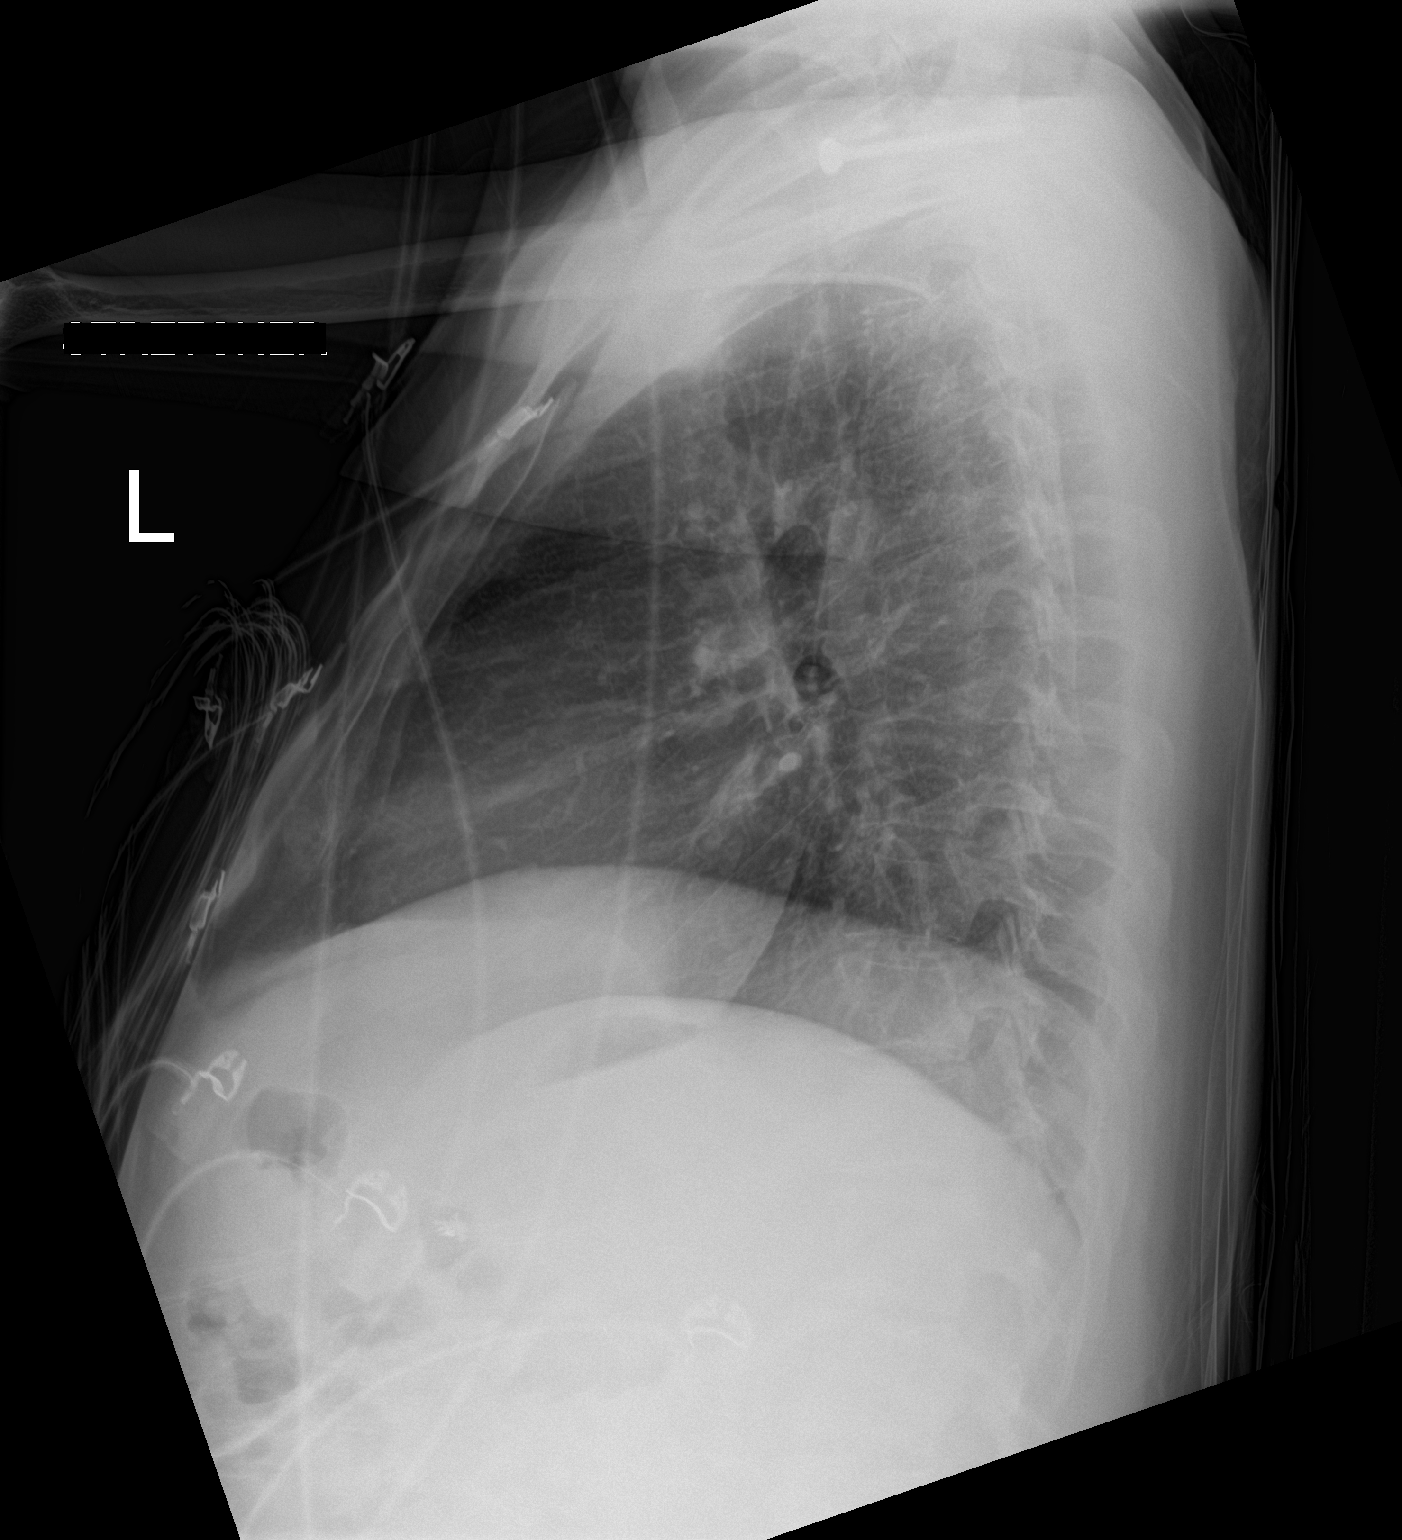

[chest ap strecther]
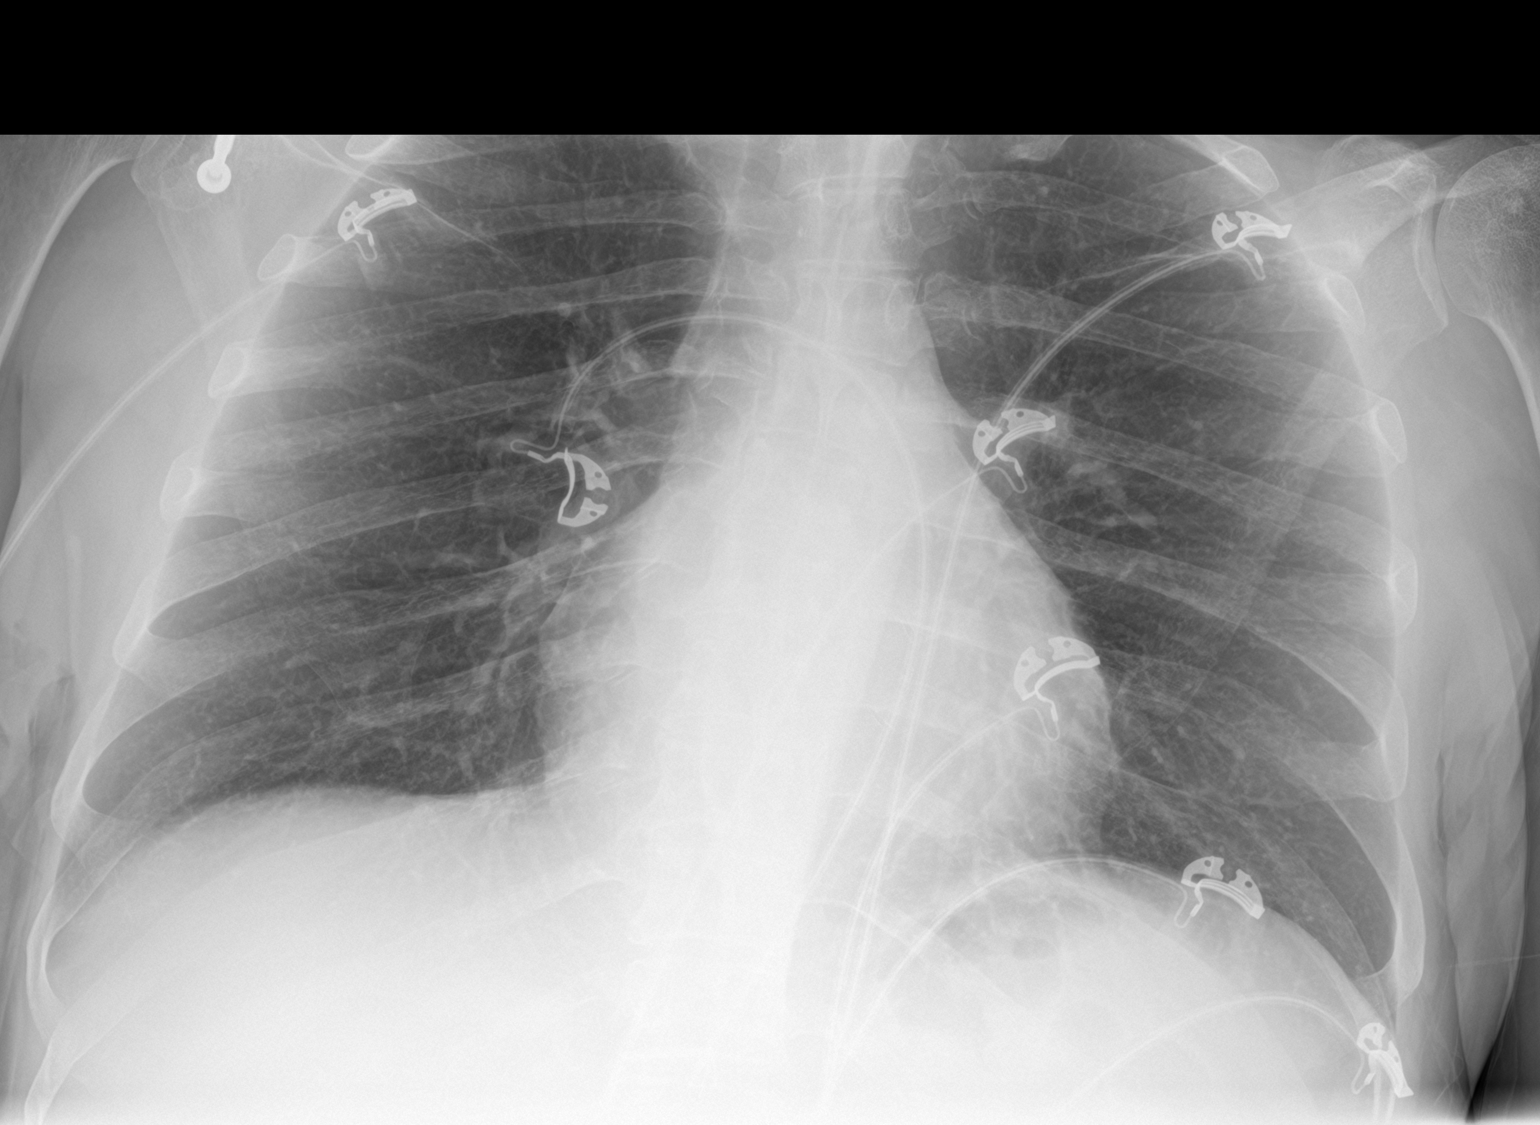

[2 of 2 positions shown; findings below may reference images not displayed]

FINDINGS: Heart size within normal limits. No evidence of airspace
consolidation within the lungs. No evidence of pleural effusion or
pneumothorax. No acute bony abnormality is identified.
IMPRESSION: No evidence of acute cardiopulmonary abnormality.
